# Patient Record
Sex: Female | Born: 1940 | Race: White | Hispanic: No | Smoking: Former smoker
Health system: Southern US, Community
[De-identification: ages and names within clinical notes are randomized; demographics above are authoritative.]

## PROBLEM LIST (undated history)

## (undated) DIAGNOSIS — I745 Embolism and thrombosis of iliac artery: Secondary | ICD-10-CM

## (undated) DIAGNOSIS — I70209 Unspecified atherosclerosis of native arteries of extremities, unspecified extremity: Secondary | ICD-10-CM

## (undated) DIAGNOSIS — Z9889 Other specified postprocedural states: Secondary | ICD-10-CM

## (undated) DIAGNOSIS — I10 Essential (primary) hypertension: Secondary | ICD-10-CM

## (undated) DIAGNOSIS — E079 Disorder of thyroid, unspecified: Secondary | ICD-10-CM

## (undated) DIAGNOSIS — I739 Peripheral vascular disease, unspecified: Secondary | ICD-10-CM

## (undated) DIAGNOSIS — R112 Nausea with vomiting, unspecified: Secondary | ICD-10-CM

## (undated) DIAGNOSIS — M1990 Unspecified osteoarthritis, unspecified site: Secondary | ICD-10-CM

## (undated) DIAGNOSIS — E119 Type 2 diabetes mellitus without complications: Secondary | ICD-10-CM

## (undated) HISTORY — DX: Type 2 diabetes mellitus without complications: E11.9

## (undated) HISTORY — DX: Peripheral vascular disease, unspecified: I73.9

## (undated) HISTORY — DX: Disorder of thyroid, unspecified: E07.9

## (undated) HISTORY — DX: Essential (primary) hypertension: I10

## (undated) HISTORY — DX: Unspecified osteoarthritis, unspecified site: M19.90

## (undated) HISTORY — DX: Unspecified atherosclerosis of native arteries of extremities, unspecified extremity: I70.209

## (undated) HISTORY — DX: Embolism and thrombosis of iliac artery: I74.5

---

## 1999-11-05 HISTORY — PX: THROMBECTOMY: PRO61

## 2005-02-21 ENCOUNTER — Ambulatory Visit: Payer: Self-pay | Admitting: Family Medicine

## 2006-05-26 ENCOUNTER — Ambulatory Visit: Payer: Self-pay | Admitting: Family Medicine

## 2007-06-17 ENCOUNTER — Ambulatory Visit: Payer: Self-pay

## 2008-06-29 ENCOUNTER — Ambulatory Visit: Payer: Self-pay | Admitting: Family Medicine

## 2008-07-28 ENCOUNTER — Ambulatory Visit: Payer: Self-pay | Admitting: Endocrinology

## 2008-08-04 ENCOUNTER — Ambulatory Visit: Payer: Self-pay | Admitting: Endocrinology

## 2008-11-04 HISTORY — PX: JOINT REPLACEMENT: SHX530

## 2008-11-04 HISTORY — PX: EYE SURGERY: SHX253

## 2009-02-16 ENCOUNTER — Ambulatory Visit: Payer: Self-pay | Admitting: Rheumatology

## 2009-04-27 ENCOUNTER — Ambulatory Visit: Payer: Self-pay | Admitting: Unknown Physician Specialty

## 2009-05-02 ENCOUNTER — Inpatient Hospital Stay: Payer: Self-pay | Admitting: Unknown Physician Specialty

## 2009-05-05 ENCOUNTER — Encounter: Payer: Self-pay | Admitting: Internal Medicine

## 2009-06-04 ENCOUNTER — Encounter: Payer: Self-pay | Admitting: Internal Medicine

## 2009-07-05 ENCOUNTER — Encounter: Payer: Self-pay | Admitting: Internal Medicine

## 2009-08-04 ENCOUNTER — Encounter: Payer: Self-pay | Admitting: Internal Medicine

## 2009-08-23 ENCOUNTER — Ambulatory Visit: Payer: Self-pay | Admitting: Family Medicine

## 2009-09-04 ENCOUNTER — Encounter: Payer: Self-pay | Admitting: Internal Medicine

## 2009-09-05 ENCOUNTER — Ambulatory Visit: Payer: Self-pay | Admitting: Ophthalmology

## 2009-09-05 ENCOUNTER — Ambulatory Visit: Payer: Self-pay | Admitting: Cardiology

## 2009-09-18 ENCOUNTER — Ambulatory Visit: Payer: Self-pay | Admitting: Ophthalmology

## 2009-10-04 ENCOUNTER — Encounter: Payer: Self-pay | Admitting: Internal Medicine

## 2009-11-04 ENCOUNTER — Encounter: Payer: Self-pay | Admitting: Internal Medicine

## 2009-12-05 ENCOUNTER — Encounter: Payer: Self-pay | Admitting: Internal Medicine

## 2010-01-02 ENCOUNTER — Encounter: Payer: Self-pay | Admitting: Internal Medicine

## 2010-02-02 ENCOUNTER — Encounter: Payer: Self-pay | Admitting: Internal Medicine

## 2010-08-29 ENCOUNTER — Ambulatory Visit: Payer: Self-pay

## 2010-11-04 HISTORY — PX: COLONOSCOPY: SHX174

## 2011-09-11 ENCOUNTER — Ambulatory Visit: Payer: Self-pay | Admitting: Family Medicine

## 2012-12-28 ENCOUNTER — Ambulatory Visit: Payer: Self-pay | Admitting: Family Medicine

## 2013-01-04 ENCOUNTER — Ambulatory Visit: Payer: Self-pay | Admitting: Family Medicine

## 2013-01-05 ENCOUNTER — Ambulatory Visit: Payer: Self-pay | Admitting: Orthopedic Surgery

## 2013-04-07 ENCOUNTER — Encounter: Payer: Self-pay | Admitting: *Deleted

## 2013-08-18 ENCOUNTER — Ambulatory Visit (INDEPENDENT_AMBULATORY_CARE_PROVIDER_SITE_OTHER): Payer: Medicare Other | Admitting: General Surgery

## 2013-08-18 ENCOUNTER — Encounter: Payer: Self-pay | Admitting: General Surgery

## 2013-08-18 VITALS — BP 130/82 | HR 68 | Resp 14 | Ht 60.0 in | Wt 130.0 lb

## 2013-08-18 DIAGNOSIS — I70219 Atherosclerosis of native arteries of extremities with intermittent claudication, unspecified extremity: Secondary | ICD-10-CM | POA: Insufficient documentation

## 2013-08-18 DIAGNOSIS — I70209 Unspecified atherosclerosis of native arteries of extremities, unspecified extremity: Secondary | ICD-10-CM

## 2013-08-18 NOTE — Progress Notes (Signed)
Patient ID: Laura [name], female   DOB: February 10, 1941, 72 y.o.   MRN: [id number]  Chief Complaint  Patient presents with  . Other    HPI Laura [name] is a 72 y.o. female. here today for follow up vascular evaluation. She is s/p R iliac artery embolectomy over 12 years ago. Pt denies any leg pain.   HPI  Past Medical History  Diagnosis Date  . Hypertension   . Diabetes mellitus without complication   . Peripheral vascular disease   . Thyroid disease   . Arthritis   . Personal history of tobacco use, presenting hazards to health   . Atherosclerosis of native arteries of the extremities, unspecified   . Embolism and thrombosis of iliac artery     Past Surgical History  Procedure Laterality Date  . Eye surgery Right 2010  . Colonoscopy  2012  . Thrombectomy  2001  . Joint replacement Left 2010    Hip    History reviewed. No pertinent family history.  Social History History  Substance Use Topics  . Smoking status: Former Smoker -- 1.00 packs/day for 20 years  . Smokeless tobacco: Never Used  . Alcohol Use: No    No Known Allergies  Current Outpatient Prescriptions  Medication Sig Dispense Refill  . alendronate (FOSAMAX) 70 MG tablet Take 70 mg by mouth every 7 (seven) days. Take with a full glass of water on an empty stomach.      Marland Kitchen aspirin 81 MG tablet Take 81 mg by mouth daily.      . benazepril (LOTENSIN) 20 MG tablet Take 20 mg by mouth daily.      . insulin lispro (HUMALOG) 100 UNIT/ML injection Inject 10 Units into the skin 3 (three) times daily before meals.      Marland Kitchen levothyroxine (SYNTHROID, LEVOTHROID) 75 MCG tablet Take 75 mcg by mouth daily before breakfast.      . pravastatin (PRAVACHOL) 40 MG tablet Take 40 mg by mouth daily.       No current facility-administered medications for this visit.    Review of Systems Review of Systems  Constitutional: Negative.   Respiratory: Negative.   Cardiovascular: Negative.     Blood pressure 130/82, pulse  68, resp. rate 14, height 5' (1.524 m), weight 130 lb (58.968 kg).  Physical Exam Physical Exam  Constitutional: She is oriented to person, place, and time. She appears well-developed and well-nourished.  Eyes: Conjunctivae are normal. No scleral icterus.  Neck: Carotid bruit is not present. No mass and no thyromegaly present.  Cardiovascular: Normal rate, regular rhythm and normal heart sounds.   Pulses:      Carotid pulses are 2+ on the right side, and 2+ on the left side.      Femoral pulses are 2+ on the right side, and 2+ on the left side.      Popliteal pulses are 2+ on the right side, and 2+ on the left side.       Dorsalis pedis pulses are 1+ on the right side, and 1+ on the left side.       Posterior tibial pulses are 0 on the right side, and 2+ on the left side.  No edema or varicose veins.  Feet are warm with brisk capillary refill.  Pulmonary/Chest: Effort normal and breath sounds normal.  Lymphadenopathy:    She has no cervical adenopathy.  Neurological: She is alert and oriented to person, place, and time.  Skin: Skin is warm and dry.  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 Asymptomatic and stable.     Plan    Continue periodic follow up. Next evaluation in 1 year.         Giannie Soliday G 08/18/2013, 12:58 PM

## 2013-08-18 NOTE — Patient Instructions (Addendum)
Patient to return in one year. Call for any leg symptoms.

## 2014-01-25 ENCOUNTER — Ambulatory Visit: Payer: Self-pay | Admitting: Family Medicine

## 2014-08-18 ENCOUNTER — Ambulatory Visit: Payer: Self-pay | Admitting: General Surgery

## 2014-09-05 ENCOUNTER — Encounter: Payer: Self-pay | Admitting: General Surgery

## 2014-11-09 ENCOUNTER — Ambulatory Visit: Payer: Self-pay | Admitting: Family Medicine

## 2014-11-14 ENCOUNTER — Ambulatory Visit (INDEPENDENT_AMBULATORY_CARE_PROVIDER_SITE_OTHER): Payer: Medicare Other | Admitting: General Surgery

## 2014-11-14 ENCOUNTER — Encounter: Payer: Self-pay | Admitting: General Surgery

## 2014-11-14 VITALS — BP 148/72 | HR 74 | Resp 14 | Ht 60.0 in | Wt 118.0 lb

## 2014-11-14 DIAGNOSIS — I739 Peripheral vascular disease, unspecified: Secondary | ICD-10-CM

## 2014-11-14 NOTE — Progress Notes (Signed)
Patient ID: Laura [name], female   DOB: 06-21-1941, 74 y.o.   MRN: [id number]  Chief Complaint  Patient presents with  . Other    leg check     HPI Laura [name] is a 74 y.o. female here today for her one year follow up vascular evaluation. She is s/p R iliac artery embolectomy over 12 years ago. Pt denies any leg pain, swelling or skin changes  HPI  Past Medical History  Diagnosis Date  . Hypertension   . Diabetes mellitus without complication   . Peripheral vascular disease   . Thyroid disease   . Arthritis   . Personal history of tobacco use, presenting hazards to health   . Atherosclerosis of native arteries of the extremities, unspecified   . Embolism and thrombosis of iliac artery     Past Surgical History  Procedure Laterality Date  . Eye surgery Right 2010  . Colonoscopy  2012  . Thrombectomy  2001  . Joint replacement Left 2010    Hip    History reviewed. No pertinent family history.  Social History History  Substance Use Topics  . Smoking status: Former Smoker -- 1.00 packs/day for 20 years  . Smokeless tobacco: Never Used  . Alcohol Use: No    No Known Allergies  Current Outpatient Prescriptions  Medication Sig Dispense Refill  . alendronate (FOSAMAX) 70 MG tablet Take 70 mg by mouth every 7 (seven) days. Take with a full glass of water on an empty stomach.    Marland Kitchen aspirin 81 MG tablet Take 81 mg by mouth daily.    . benazepril (LOTENSIN) 20 MG tablet Take 20 mg by mouth daily.    . insulin glargine (LANTUS) 100 UNIT/ML injection Inject into the skin at bedtime.    . insulin lispro (HUMALOG) 100 UNIT/ML injection Inject 10 Units into the skin 3 (three) times daily before meals.    Marland Kitchen levothyroxine (SYNTHROID, LEVOTHROID) 75 MCG tablet Take 75 mcg by mouth daily before breakfast.    . pravastatin (PRAVACHOL) 40 MG tablet Take 40 mg by mouth daily.     No current facility-administered medications for this visit.    Review of Systems Review of  Systems  Constitutional: Negative.   Respiratory: Negative.   Cardiovascular: Negative.     Blood pressure 148/72, pulse 74, resp. rate 14, height 5' (1.524 m), weight 118 lb (53.524 kg).  Physical Exam Physical Exam  Constitutional: She is oriented to person, place, and time. She appears well-developed and well-nourished.  Eyes: Conjunctivae are normal. No scleral icterus.  Neck: Neck supple.  Cardiovascular: Normal rate, regular rhythm and normal heart sounds.   Pulses:      Carotid pulses are 2+ on the right side, and 2+ on the left side.      Radial pulses are 2+ on the right side, and 2+ on the left side.       Femoral pulses are 2+ on the right side, and 2+ on the left side.      Popliteal pulses are 2+ on the right side, and 2+ on the left side.       Dorsalis pedis pulses are 2+ on the right side, and 1+ on the left side.       Posterior tibial pulses are 0 on the right side, and 2+ on the left side.  Feet are warm, brisk cap refill. No edema , no VV  Pulmonary/Chest: Effort normal and breath sounds normal.  Abdominal: Soft. Bowel  sounds are normal. There is no tenderness.  Lymphadenopathy:    She has no cervical adenopathy.  Neurological: She is alert and oriented to person, place, and time.  Skin: Skin is warm and dry.  Vitals reviewed.   Data Reviewed   None Assessment   Mild PAD-involving tibial arteries.  Asymptomatic and stable.     Plan    Patient to return in one year.       SANKAR,SEEPLAPUTHUR G 11/14/2014, 2:37 PM

## 2014-11-14 NOTE — Patient Instructions (Signed)
Patient to return in one year. 

## 2015-02-01 ENCOUNTER — Ambulatory Visit: Admit: 2015-02-01 | Disposition: A | Discharge status: Home / Self Care | Payer: Self-pay | Admitting: Family Medicine

## 2015-03-21 ENCOUNTER — Ambulatory Visit
Admission: RE | Admit: 2015-03-21 | Discharge: 2015-03-21 | Disposition: A | Discharge status: Home / Self Care | Payer: Medicare Other | Source: Ambulatory Visit | Attending: Family Medicine | Admitting: Family Medicine

## 2015-03-21 ENCOUNTER — Other Ambulatory Visit: Payer: Self-pay | Admitting: Family Medicine

## 2015-03-21 DIAGNOSIS — M25552 Pain in left hip: Secondary | ICD-10-CM | POA: Diagnosis not present

## 2015-03-21 DIAGNOSIS — S79912A Unspecified injury of left hip, initial encounter: Secondary | ICD-10-CM | POA: Diagnosis not present

## 2015-03-21 DIAGNOSIS — M1611 Unilateral primary osteoarthritis, right hip: Secondary | ICD-10-CM | POA: Diagnosis not present

## 2015-03-29 DIAGNOSIS — M1611 Unilateral primary osteoarthritis, right hip: Secondary | ICD-10-CM | POA: Diagnosis not present

## 2015-04-12 DIAGNOSIS — M1611 Unilateral primary osteoarthritis, right hip: Secondary | ICD-10-CM | POA: Diagnosis not present

## 2015-04-14 NOTE — Progress Notes (Signed)
Please put orders in Epic surgery [id number] pre op 04-21-15 Thanks

## 2015-04-16 ENCOUNTER — Ambulatory Visit: Payer: Self-pay | Admitting: Orthopedic Surgery

## 2015-04-16 MED ORDER — DEXAMETHASONE SODIUM PHOSPHATE 4 MG/ML IJ SOLN: 4.0000 mg | INTRAMUSCULAR | Status: AC

## 2015-04-16 MED ORDER — ACETAMINOPHEN 10 MG/ML IV SOLN: 1000.0000 mg | INTRAVENOUS | Status: AC

## 2015-04-16 MED ORDER — SODIUM CHLORIDE 0.9 % IV SOLN: 4.0000 mg | INTRAVENOUS | Status: AC

## 2015-04-17 ENCOUNTER — Telehealth: Payer: Self-pay | Admitting: Family Medicine

## 2015-04-17 NOTE — Telephone Encounter (Signed)
Patient was informed that forms has been completed and faxed. Patient stated she goes for a pre-op work-up on Friday and then will have her surgery next week.

## 2015-04-17 NOTE — Telephone Encounter (Signed)
Pt will be having hip replacement on April 27 2015 and she is needing surgical clearance. Have you received the forms and does she need to come in to be seen? Please return call [phone]

## 2015-04-17 NOTE — Telephone Encounter (Signed)
Surgical clearance forms filled out and faxed back. No office appointment needed unless they find anything abnormal during pre-operation work up.

## 2015-04-20 NOTE — Patient Instructions (Addendum)
YOUR PROCEDURE IS SCHEDULED ON : 04/27/15  REPORT TO [facility] MAIN ENTRANCE FOLLOW SIGNS TO SHORT STAY CENTER AT :  11:30 AM  CALL THIS NUMBER IF YOU HAVE PROBLEMS THE MORNING OF SURGERY [phone]  REMEMBER:ONLY 1 PER PERSON MAY GO TO SHORT STAY WITH YOU TO GET READY THE MORNING OF YOUR SURGERY  DO NOT EAT FOOD  AFTER MIDNIGHT  MAY HAVE CLEAR LIQUIDS UNTIL 7:30 AM  TAKE THESE MEDICINES THE MORNING OF SURGERY:  LEVOTHYROXINE / DO NOT TAKE INSULIN THE MORNING OF SURGERY     CLEAR LIQUID DIET   Foods Allowed                                                                     Foods Excluded  Coffee and tea, regular and decaf                             liquids that you cannot  Plain Jell-O in any flavor                                             see through such as: Fruit ices (not with fruit pulp)                                     milk, soups, orange juice  Iced Popsicles                                                 All solid food Carbonated beverages, regular and diet                                    Cranberry, grape and apple juices Sports drinks like Gatorade Lightly seasoned clear broth or consume(fat free) Sugar, honey syrup  _____________________________________________________________________    YOU MAY NOT HAVE ANY METAL ON YOUR BODY INCLUDING HAIR PINS AND PIERCING'S. DO NOT WEAR JEWELRY, MAKEUP, LOTIONS, POWDERS OR PERFUMES. DO NOT WEAR NAIL POLISH. DO NOT SHAVE 48 HRS PRIOR TO SURGERY. MEN MAY SHAVE FACE AND NECK.  DO NOT BRING VALUABLES TO HOSPITAL. [place] [facility] IS NOT RESPONSIBLE FOR VALUABLES.  CONTACTS, DENTURES OR PARTIALS MAY NOT BE WORN TO SURGERY. LEAVE SUITCASE IN CAR. CAN BE BROUGHT TO ROOM AFTER SURGERY.  PATIENTS DISCHARGED THE DAY OF SURGERY WILL NOT BE ALLOWED TO DRIVE HOME.  PLEASE READ OVER THE FOLLOWING INSTRUCTION SHEETS _________________________________________________________________________________                                           [place] - PREPARING FOR SURGERY  Before surgery, you can play an important role.  Because skin is not sterile, your  skin needs to be as free of germs as possible.  You can reduce the number of germs on your skin by washing with CHG (chlorahexidine gluconate) soap before surgery.  CHG is an antiseptic cleaner which kills germs and bonds with the skin to continue killing germs even after washing. Please DO NOT use if you have an allergy to CHG or antibacterial soaps.  If your skin becomes reddened/irritated stop using the CHG and inform your nurse when you arrive at Short Stay. Do not shave (including legs and underarms) for at least 48 hours prior to the first CHG shower.  You may shave your face. Please follow these instructions carefully:   1.  Shower with CHG Soap the night before surgery and the  morning of Surgery.   2.  If you choose to wash your hair, wash your hair first as usual with your  normal  Shampoo.   3.  After you shampoo, rinse your hair and body thoroughly to remove the  shampoo.                                         4.  Use CHG as you would any other liquid soap.  You can apply chg directly  to the skin and wash . Gently wash with scrungie or clean wascloth    5.  Apply the CHG Soap to your body ONLY FROM THE NECK DOWN.   Do not use on open                           Wound or open sores. Avoid contact with eyes, ears mouth and genitals (private parts).                        Genitals (private parts) with your normal soap.              6.  Wash thoroughly, paying special attention to the area where your surgery  will be performed.   7.  Thoroughly rinse your body with warm water from the neck down.   8.  DO NOT shower/wash with your normal soap after using and rinsing off  the CHG Soap .                9.  Pat yourself dry with a clean towel.             10.  Wear clean night clothes to bed after shower             11.  Place clean  sheets on your bed the night of your first shower and do not  sleep with pets.  Day of Surgery : Do not apply any lotions/deodorants the morning of surgery.  Please wear clean clothes to the hospital/surgery center.  FAILURE TO FOLLOW THESE INSTRUCTIONS MAY RESULT IN THE CANCELLATION OF YOUR SURGERY    PATIENT SIGNATURE_________________________________  ______________________________________________________________________     Laura [name]  An incentive spirometer is a tool that can help keep your lungs clear and active. This tool measures how well you are filling your lungs with each breath. Taking long deep breaths may help reverse or decrease the chance of developing breathing (pulmonary) problems (especially infection) following:  A long period of time when you are unable to move or be active. BEFORE THE PROCEDURE  If the spirometer includes an indicator to show your best effort, your nurse or respiratory therapist will set it to a desired goal.  If possible, sit up straight or lean slightly forward. Try not to slouch.  Hold the incentive spirometer in an upright position. INSTRUCTIONS FOR USE   Sit on the edge of your bed if possible, or sit up as far as you can in bed or on a chair.  Hold the incentive spirometer in an upright position.  Breathe out normally.  Place the mouthpiece in your mouth and seal your lips tightly around it.  Breathe in slowly and as deeply as possible, raising the piston or the ball toward the top of the column.  Hold your breath for 3-5 seconds or for as long as possible. Allow the piston or ball to fall to the bottom of the column.  Remove the mouthpiece from your mouth and breathe out normally.  Rest for a few seconds and repeat Steps 1 through 7 at least 10 times every 1-2 hours when you are awake. Take your time and take a few normal breaths between deep breaths.  The spirometer may include an indicator to show your best  effort. Use the indicator as a goal to work toward during each repetition.  After each set of 10 deep breaths, practice coughing to be sure your lungs are clear. If you have an incision (the cut made at the time of surgery), support your incision when coughing by placing a pillow or rolled up towels firmly against it. Once you are able to get out of bed, walk around indoors and cough well. You may stop using the incentive spirometer when instructed by your caregiver.  RISKS AND COMPLICATIONS  Take your time so you do not get dizzy or light-headed.  If you are in pain, you may need to take or ask for pain medication before doing incentive spirometry. It is harder to take a deep breath if you are having pain. AFTER USE  Rest and breathe slowly and easily.  It can be helpful to keep track of a log of your progress. Your caregiver can provide you with a simple table to help with this. If you are using the spirometer at home, follow these instructions: [place] IF:   You are having difficultly using the spirometer.  You have trouble using the spirometer as often as instructed.  Your pain medication is not giving enough relief while using the spirometer.  You develop fever of 100.5 F (38.1 C) or higher. SEEK IMMEDIATE MEDICAL CARE IF:   You cough up bloody sputum that had not been present before.  You develop fever of 102 F (38.9 C) or greater.  You develop worsening pain at or near the incision site. MAKE SURE YOU:   Understand these instructions.  Will watch your condition.  Will get help right away if you are not doing well or get worse. Document Released: 03/03/2007 Document Revised: 01/13/2012 Document Reviewed: 05/04/2007 Exit[employer] Patient Information 2014 [employer], [place].   ________________________________________________________________________  WHAT IS A BLOOD TRANSFUSION? Blood Transfusion Information  A transfusion is the replacement of blood or some of  its parts. Blood is made up of multiple cells which provide different functions.  Red blood cells carry oxygen and are used for blood loss replacement.  White blood cells fight against infection.  Platelets control bleeding.  Plasma helps clot blood.  Other blood products are available for specialized needs, such as hemophilia or other clotting  disorders. BEFORE THE TRANSFUSION  Who gives blood for transfusions?   Healthy volunteers who are fully evaluated to make sure their blood is safe. This is blood bank blood. Transfusion therapy is the safest it has ever been in the practice of medicine. Before blood is taken from a donor, a complete history is taken to make sure that person has no history of diseases nor engages in risky social behavior (examples are intravenous drug use or sexual activity with multiple partners). The donor's travel history is screened to minimize risk of transmitting infections, such as malaria. The donated blood is tested for signs of infectious diseases, such as HIV and hepatitis. The blood is then tested to be sure it is compatible with you in order to minimize the chance of a transfusion reaction. If you or a relative donates blood, this is often done in anticipation of surgery and is not appropriate for emergency situations. It takes many days to process the donated blood. RISKS AND COMPLICATIONS Although transfusion therapy is very safe and saves many lives, the main dangers of transfusion include:   Getting an infectious disease.  Developing a transfusion reaction. This is an allergic reaction to something in the blood you were given. Every precaution is taken to prevent this. The decision to have a blood transfusion has been considered carefully by your caregiver before blood is given. Blood is not given unless the benefits outweigh the risks. AFTER THE TRANSFUSION  Right after receiving a blood transfusion, you will usually feel much better and more  energetic. This is especially true if your red blood cells have gotten low (anemic). The transfusion raises the level of the red blood cells which carry oxygen, and this usually causes an energy increase.  The nurse administering the transfusion will monitor you carefully for complications. HOME CARE INSTRUCTIONS  No special instructions are needed after a transfusion. You may find your energy is better. Speak with your caregiver about any limitations on activity for underlying diseases you may have. SEEK MEDICAL CARE IF:   Your condition is not improving after your transfusion.  You develop redness or irritation at the intravenous (IV) site. SEEK IMMEDIATE MEDICAL CARE IF:  Any of the following symptoms occur over the next 12 hours:  Shaking chills.  You have a temperature by mouth above 102 F (38.9 C), not controlled by medicine.  Chest, back, or muscle pain.  People around you feel you are not acting correctly or are confused.  Shortness of breath or difficulty breathing.  Dizziness and fainting.  You get a rash or develop hives.  You have a decrease in urine output.  Your urine turns a dark color or changes to pink, red, or brown. Any of the following symptoms occur over the next 10 days:  You have a temperature by mouth above 102 F (38.9 C), not controlled by medicine.  Shortness of breath.  Weakness after normal activity.  The white part of the eye turns yellow (jaundice).  You have a decrease in the amount of urine or are urinating less often.  Your urine turns a dark color or changes to pink, red, or brown. Document Released: 10/18/2000 Document Revised: 01/13/2012 Document Reviewed: 06/06/2008 [facility] Patient Information 2014 [place], [place].  _______________________________________________________________________

## 2015-04-21 ENCOUNTER — Encounter (HOSPITAL_COMMUNITY): Payer: Self-pay

## 2015-04-21 ENCOUNTER — Encounter (HOSPITAL_COMMUNITY)
Admission: RE | Admit: 2015-04-21 | Discharge: 2015-04-21 | Disposition: A | Discharge status: Home / Self Care | Payer: Medicare Other | Source: Ambulatory Visit | Attending: Orthopedic Surgery | Admitting: Orthopedic Surgery

## 2015-04-21 DIAGNOSIS — M1611 Unilateral primary osteoarthritis, right hip: Secondary | ICD-10-CM | POA: Diagnosis not present

## 2015-04-21 DIAGNOSIS — Z01818 Encounter for other preprocedural examination: Secondary | ICD-10-CM | POA: Insufficient documentation

## 2015-04-21 LAB — COMPREHENSIVE METABOLIC PANEL
ALBUMIN: 3.8 g/dL (ref 3.5–5.0)
ALT: 15 U/L (ref 14–54)
AST: 15 U/L (ref 15–41)
Alkaline Phosphatase: 57 U/L (ref 38–126)
Anion gap: 8 (ref 5–15)
BUN: 24 mg/dL — ABNORMAL HIGH (ref 6–20)
CALCIUM: 10.1 mg/dL (ref 8.9–10.3)
CO2: 30 mmol/L (ref 22–32)
CREATININE: 0.7 mg/dL (ref 0.44–1.00)
Chloride: 102 mmol/L (ref 101–111)
GFR calc Af Amer: >60 mL/min (ref >60)
GFR calc non Af Amer: >60 mL/min (ref >60)
Glucose, Bld: 82 mg/dL (ref 65–99)
POTASSIUM: 4.9 mmol/L (ref 3.5–5.1)
Sodium: 140 mmol/L (ref 135–145)
Total Bilirubin: 0.3 mg/dL (ref 0.3–1.2)
Total Protein: 6.1 g/dL — ABNORMAL LOW (ref 6.5–8.1)

## 2015-04-21 LAB — CBC
HEMATOCRIT: 38.2 % (ref 36.0–46.0)
HEMOGLOBIN: 12.6 g/dL (ref 12.0–15.0)
MCH: 29.3 pg (ref 26.0–34.0)
MCHC: 33 g/dL (ref 30.0–36.0)
MCV: 88.8 fL (ref 78.0–100.0)
Platelets: 227 10*3/uL (ref 150–400)
RBC: 4.3 MIL/uL (ref 3.87–5.11)
RDW: 13.1 % (ref 11.5–15.5)
WBC: 4.9 10*3/uL (ref 4.0–10.5)

## 2015-04-21 LAB — SURGICAL PCR SCREEN
MRSA, PCR: NEGATIVE
STAPHYLOCOCCUS AUREUS: POSITIVE — AB

## 2015-04-21 LAB — URINALYSIS, ROUTINE W REFLEX MICROSCOPIC
BILIRUBIN URINE: NEGATIVE
GLUCOSE, UA: NEGATIVE mg/dL
Hgb urine dipstick: NEGATIVE
KETONES UR: NEGATIVE mg/dL
Leukocytes, UA: NEGATIVE
Nitrite: NEGATIVE
PROTEIN: NEGATIVE mg/dL
Specific Gravity, Urine: 1.01 (ref 1.005–1.030)
UROBILINOGEN UA: 0.2 mg/dL (ref 0.0–1.0)
pH: 6 (ref 5.0–8.0)

## 2015-04-21 LAB — ABO/RH: ABO/RH(D): AB POS

## 2015-04-21 LAB — PROTIME-INR
INR: 1.03 (ref 0.00–1.49)
Prothrombin Time: 13.7 seconds (ref 11.6–15.2)

## 2015-04-21 LAB — APTT: APTT: 27 s (ref 24–37)

## 2015-04-21 NOTE — Progress Notes (Signed)
CMET / PCR faxed to Dr. Linna Caprice / Rx Mupuricin called to [facility] [phone] - pt notified

## 2015-04-24 ENCOUNTER — Telehealth: Payer: Self-pay | Admitting: Family Medicine

## 2015-04-24 NOTE — Anesthesia Preprocedure Evaluation (Addendum)
Anesthesia Evaluation  Patient identified by MRN, date of birth, ID band Patient awake    Reviewed: Allergy & Precautions, NPO status , Patient's Chart, lab work & pertinent test results, reviewed documented beta blocker date and time   History of Anesthesia Complications (+) PONV and history of anesthetic complications  Airway Mallampati: II   Neck ROM: full    Dental   Pulmonary former smoker (quit 2004, 20 pack year),  breath sounds clear to auscultation        Cardiovascular hypertension, Pt. on medications + Peripheral Vascular Disease Rhythm:regular Rate:Normal     Neuro/Psych    GI/Hepatic negative GI ROS, Neg liver ROS,   Endo/Other  diabetes, Type 2, Insulin Dependent  Renal/GU      Musculoskeletal  (+) Arthritis -,   Abdominal   Peds  Hematology 12/28, plts 227   Anesthesia Other Findings   Reproductive/Obstetrics                         Anesthesia Physical Anesthesia Plan  ASA: III  Anesthesia Plan: Spinal and MAC   Post-op Pain Management:    Induction: Intravenous  Airway Management Planned: Natural Airway  Additional Equipment:   Intra-op Plan:   Post-operative Plan:   Informed Consent: I have reviewed the patients History and Physical, chart, labs and discussed the procedure including the risks, benefits and alternatives for the proposed anesthesia with the patient or authorized representative who has indicated his/her understanding and acceptance.   Dental advisory given  Plan Discussed with: CRNA  Anesthesia Plan Comments:       Anesthesia Quick Evaluation

## 2015-04-24 NOTE — Telephone Encounter (Signed)
Laura [name] from Dr Kathline Magic office is calling wanting to know when they can get her surgical clearance paperwork that was faxed on 04/13/15 and 04/19/15. Patient is having surgery on 04/27/15.

## 2015-04-24 NOTE — Telephone Encounter (Signed)
Paperwork has already been completed and has been sent to be scanned into her chart. Paperwork was faxed to the attention of Matthias Hughs at [phone]. Confirmation was received.

## 2015-04-27 ENCOUNTER — Ambulatory Visit: Payer: Self-pay | Admitting: Orthopedic Surgery

## 2015-04-27 DIAGNOSIS — Z794 Long term (current) use of insulin: Secondary | ICD-10-CM | POA: Diagnosis not present

## 2015-04-27 DIAGNOSIS — Z79899 Other long term (current) drug therapy: Secondary | ICD-10-CM | POA: Diagnosis not present

## 2015-04-27 DIAGNOSIS — M1611 Unilateral primary osteoarthritis, right hip: Secondary | ICD-10-CM | POA: Diagnosis not present

## 2015-04-27 DIAGNOSIS — Z96642 Presence of left artificial hip joint: Secondary | ICD-10-CM | POA: Diagnosis not present

## 2015-04-27 DIAGNOSIS — E119 Type 2 diabetes mellitus without complications: Secondary | ICD-10-CM | POA: Diagnosis not present

## 2015-04-27 DIAGNOSIS — Z87891 Personal history of nicotine dependence: Secondary | ICD-10-CM | POA: Diagnosis not present

## 2015-04-27 DIAGNOSIS — I10 Essential (primary) hypertension: Secondary | ICD-10-CM | POA: Diagnosis not present

## 2015-04-27 LAB — TYPE AND SCREEN
ABO/RH(D): AB POS
Antibody Screen: NEGATIVE

## 2015-04-27 NOTE — H&P (Signed)
TOTAL HIP ADMISSION H&P  Patient is admitted for right total hip arthroplasty.  Subjective:  Chief Complaint: right hip pain  HPI: Lillard AnesWilma F Fahs, 74 y.o. 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  Onset of symptoms was gradual starting 6 years ago with gradually worsening course since that time.The patient noted no past surgery on the right hip(s).  Patient currently rates pain in the right hip at 10 out of 10 with activity. Patient has night pain, worsening of pain with activity and weight bearing and pain that interfers with activities of daily living. Patient has evidence of subchondral cysts, subchondral sclerosis, periarticular osteophytes, joint space narrowing and collapse of the femoral head by imaging studies. This condition presents safety issues increasing the risk of falls. There is no current active infection.  Patient Active Problem List   Diagnosis Date Noted  . Atherosclerosis of native arteries of the extremities, unspecified 08/18/2013   Past Medical History  Diagnosis Date  . Hypertension   . Diabetes mellitus without complication   . Peripheral vascular disease   . Thyroid disease   . Arthritis   . Atherosclerosis of native arteries of the extremities, unspecified   . Embolism and thrombosis of iliac artery     Past Surgical History  Procedure Laterality Date  . Eye surgery Right 2010  . Colonoscopy  2012  . Thrombectomy  2001    RT GROIN  . Joint replacement Left 2010    Hip     (Not in a hospital admission) No Known Allergies  History  Substance Use Topics  . Smoking status: Former Smoker -- 1.00 packs/day for 20 years    Quit date: 04/21/2003  . Smokeless tobacco: Never Used  .  Alcohol Use: No    No family history on file.   Review of Systems  Constitutional: Negative.   HENT: Negative.   Eyes: Negative.   Respiratory: Negative.   Cardiovascular: Negative.   Gastrointestinal: Negative.   Genitourinary: Negative.   Musculoskeletal: Positive for joint pain.  Skin: Negative.   Neurological: Negative.   Endo/Heme/Allergies: Negative.   Psychiatric/Behavioral: Negative.     Objective:  Physical Exam  Vitals reviewed. Constitutional: She is oriented to person, place, and time. She appears well-developed and well-nourished.  HENT:  Head: Normocephalic and atraumatic.  Eyes: Conjunctivae and EOM are normal. Pupils are equal, round, and reactive to light.  Neck: Normal range of motion. Neck supple.  Cardiovascular: Normal rate, regular rhythm, normal heart sounds and intact distal pulses.   Respiratory: Effort normal and breath sounds normal.  GI: Soft. Bowel sounds are normal.  Genitourinary:  deferred  Musculoskeletal:       Right hip: She exhibits decreased range of motion.  Neurological: She is alert and oriented to person, place, and time. She has normal reflexes.  Skin: Skin is warm and dry.  Psychiatric: She has a normal mood and affect. Her behavior is normal. Judgment and thought content normal.    Vital signs in last 24 hours: @VSRANGES @  Labs:   Estimated body mass index is 22.50 kg/(m^2) as calculated from the following:   Height as of 04/21/15: 5\' 1"  (1.549 m).   Weight as of 04/21/15: 53.978 kg (119 lb).   Imaging Review Plain radiographs demonstrate severe  degenerative joint disease of the right hip(s). The bone quality appears to be adequate for age and reported activity level.  Assessment/Plan:  End stage arthritis, right hip(s)  The patient history, physical examination, clinical judgement of the provider and imaging studies are consistent with end stage degenerative joint disease of the right hip(s) and total hip  arthroplasty is deemed medically necessary. The treatment options including medical management, injection therapy, arthroscopy and arthroplasty were discussed at length. The risks and benefits of total hip arthroplasty were presented and reviewed. The risks due to aseptic loosening, infection, stiffness, dislocation/subluxation,  thromboembolic complications and other imponderables were discussed.  The patient acknowledged the explanation, agreed to proceed with the plan and consent was signed. Patient is being admitted for inpatient treatment for surgery, pain control, PT, OT, prophylactic antibiotics, VTE prophylaxis, progressive ambulation and ADL's and discharge planning.The patient is planning to be discharged home with home health services. Plan for apixiban.

## 2015-04-28 ENCOUNTER — Other Ambulatory Visit: Payer: Self-pay | Admitting: Family Medicine

## 2015-04-28 ENCOUNTER — Encounter (HOSPITAL_COMMUNITY): Payer: Self-pay | Admitting: *Deleted

## 2015-04-28 DIAGNOSIS — Z794 Long term (current) use of insulin: Principal | ICD-10-CM

## 2015-04-28 DIAGNOSIS — E1165 Type 2 diabetes mellitus with hyperglycemia: Secondary | ICD-10-CM

## 2015-04-28 DIAGNOSIS — E1151 Type 2 diabetes mellitus with diabetic peripheral angiopathy without gangrene: Secondary | ICD-10-CM | POA: Insufficient documentation

## 2015-04-28 DIAGNOSIS — IMO0002 Reserved for concepts with insufficient information to code with codable children: Secondary | ICD-10-CM | POA: Insufficient documentation

## 2015-04-28 DIAGNOSIS — E119 Type 2 diabetes mellitus without complications: Secondary | ICD-10-CM

## 2015-04-28 MED ORDER — GLUCOSE BLOOD VI STRP: ORAL_STRIP | Status: DC

## 2015-04-28 NOTE — Telephone Encounter (Signed)
Patient called requesting a refill of her glucose strips

## 2015-04-28 NOTE — Progress Notes (Signed)
Pt states she has used the Mupirocin ointment 2x a day for 5 days. Finished it on 04/27/15. Pt states her last dose of Aspirin was 04/26/15.  Denies any chest pain or sob, no cardiac history.

## 2015-04-28 NOTE — Telephone Encounter (Signed)
Pt is needing STRIPS only for her ACCU CHECK MACHINE . PHARM IS [facility].

## 2015-05-01 ENCOUNTER — Encounter (HOSPITAL_COMMUNITY)
Admission: RE | Disposition: A | Discharge status: Home Health | Payer: Self-pay | Source: Ambulatory Visit | Attending: Orthopedic Surgery

## 2015-05-01 ENCOUNTER — Inpatient Hospital Stay (HOSPITAL_COMMUNITY)
Admission: RE | Admit: 2015-05-01 | Discharge: 2015-05-02 | DRG: 470 | Disposition: A | Discharge status: Home Health | Payer: Medicare Other | Source: Ambulatory Visit | Attending: Orthopedic Surgery | Admitting: Orthopedic Surgery

## 2015-05-01 ENCOUNTER — Inpatient Hospital Stay (HOSPITAL_COMMUNITY): Payer: Medicare Other

## 2015-05-01 ENCOUNTER — Encounter (HOSPITAL_COMMUNITY): Payer: Self-pay | Admitting: *Deleted

## 2015-05-01 ENCOUNTER — Inpatient Hospital Stay (HOSPITAL_COMMUNITY): Payer: Medicare Other | Admitting: Anesthesiology

## 2015-05-01 DIAGNOSIS — Z87891 Personal history of nicotine dependence: Secondary | ICD-10-CM | POA: Diagnosis not present

## 2015-05-01 DIAGNOSIS — E119 Type 2 diabetes mellitus without complications: Secondary | ICD-10-CM | POA: Diagnosis not present

## 2015-05-01 DIAGNOSIS — M1611 Unilateral primary osteoarthritis, right hip: Secondary | ICD-10-CM | POA: Diagnosis not present

## 2015-05-01 DIAGNOSIS — Z96642 Presence of left artificial hip joint: Secondary | ICD-10-CM | POA: Diagnosis present

## 2015-05-01 DIAGNOSIS — I10 Essential (primary) hypertension: Secondary | ICD-10-CM | POA: Diagnosis not present

## 2015-05-01 DIAGNOSIS — Z96641 Presence of right artificial hip joint: Secondary | ICD-10-CM | POA: Diagnosis not present

## 2015-05-01 DIAGNOSIS — Z794 Long term (current) use of insulin: Secondary | ICD-10-CM | POA: Diagnosis not present

## 2015-05-01 DIAGNOSIS — I739 Peripheral vascular disease, unspecified: Secondary | ICD-10-CM | POA: Diagnosis not present

## 2015-05-01 DIAGNOSIS — Z419 Encounter for procedure for purposes other than remedying health state, unspecified: Secondary | ICD-10-CM

## 2015-05-01 DIAGNOSIS — Z09 Encounter for follow-up examination after completed treatment for conditions other than malignant neoplasm: Secondary | ICD-10-CM

## 2015-05-01 DIAGNOSIS — Z96643 Presence of artificial hip joint, bilateral: Secondary | ICD-10-CM | POA: Diagnosis present

## 2015-05-01 DIAGNOSIS — Z471 Aftercare following joint replacement surgery: Secondary | ICD-10-CM | POA: Diagnosis not present

## 2015-05-01 DIAGNOSIS — Z79899 Other long term (current) drug therapy: Secondary | ICD-10-CM | POA: Diagnosis not present

## 2015-05-01 DIAGNOSIS — M25551 Pain in right hip: Secondary | ICD-10-CM | POA: Diagnosis present

## 2015-05-01 DIAGNOSIS — M169 Osteoarthritis of hip, unspecified: Secondary | ICD-10-CM | POA: Diagnosis not present

## 2015-05-01 HISTORY — PX: TOTAL HIP ARTHROPLASTY: SHX124

## 2015-05-01 HISTORY — DX: Nausea with vomiting, unspecified: R11.2

## 2015-05-01 HISTORY — DX: Other specified postprocedural states: Z98.890

## 2015-05-01 LAB — TYPE AND SCREEN
ABO/RH(D): AB POS
Antibody Screen: NEGATIVE

## 2015-05-01 LAB — GLUCOSE, CAPILLARY
GLUCOSE-CAPILLARY: 125 mg/dL — AB (ref 65–99)
Glucose-Capillary: 308 mg/dL — ABNORMAL HIGH (ref 65–99)
Glucose-Capillary: 247 mg/dL — ABNORMAL HIGH (ref 65–99)
Glucose-Capillary: 259 mg/dL — ABNORMAL HIGH (ref 65–99)

## 2015-05-01 LAB — ABO/RH: ABO/RH(D): AB POS

## 2015-05-01 SURGERY — ARTHROPLASTY, HIP, TOTAL, ANTERIOR APPROACH
Anesthesia: Monitor Anesthesia Care | Site: Hip | Laterality: Right

## 2015-05-01 MED ORDER — METOCLOPRAMIDE HCL 5 MG/ML IJ SOLN: 5.0000 mg | Freq: Three times a day (TID) | INTRAMUSCULAR | Status: DC | PRN

## 2015-05-01 MED ORDER — LEVOTHYROXINE SODIUM 75 MCG PO TABS
75.0000 ug | ORAL_TABLET | Freq: Every day | ORAL | Status: DC
  Administered 2015-05-02: 75 ug via ORAL
  Filled 2015-05-01: qty 1

## 2015-05-01 MED ORDER — FENTANYL CITRATE (PF) 250 MCG/5ML IJ SOLN
INTRAMUSCULAR | Status: AC
  Filled 2015-05-01: qty 5

## 2015-05-01 MED ORDER — OXYCODONE HCL 5 MG PO TABS: 5.0000 mg | ORAL_TABLET | Freq: Once | ORAL | Status: DC | PRN

## 2015-05-01 MED ORDER — INSULIN ASPART 100 UNIT/ML ~~LOC~~ SOLN
6.0000 [IU] | Freq: Every day | SUBCUTANEOUS | Status: DC
  Administered 2015-05-02: 6 [IU] via SUBCUTANEOUS

## 2015-05-01 MED ORDER — HYDROCHLOROTHIAZIDE 25 MG PO TABS
25.0000 mg | ORAL_TABLET | Freq: Every day | ORAL | Status: DC
  Filled 2015-05-01 (×2): qty 1

## 2015-05-01 MED ORDER — HYDROMORPHONE HCL 1 MG/ML IJ SOLN: 0.2500 mg | INTRAMUSCULAR | Status: DC | PRN

## 2015-05-01 MED ORDER — CHLORHEXIDINE GLUCONATE 4 % EX LIQD: 60.0000 mL | Freq: Once | CUTANEOUS | Status: DC

## 2015-05-01 MED ORDER — BENAZEPRIL HCL 20 MG PO TABS
20.0000 mg | ORAL_TABLET | Freq: Every day | ORAL | Status: DC
  Filled 2015-05-01 (×2): qty 1

## 2015-05-01 MED ORDER — ONDANSETRON HCL 4 MG/2ML IJ SOLN: 4.0000 mg | Freq: Four times a day (QID) | INTRAMUSCULAR | Status: DC | PRN

## 2015-05-01 MED ORDER — ONDANSETRON HCL 4 MG PO TABS: 4.0000 mg | ORAL_TABLET | Freq: Four times a day (QID) | ORAL | Status: DC | PRN

## 2015-05-01 MED ORDER — ACETAMINOPHEN 10 MG/ML IV SOLN
1000.0000 mg | INTRAVENOUS | Status: AC
  Administered 2015-05-01: 1000 mg via INTRAVENOUS
  Filled 2015-05-01: qty 100

## 2015-05-01 MED ORDER — PHENOL 1.4 % MT LIQD
1.0000 | OROMUCOSAL | Status: DC | PRN
Start: 2015-05-01 — End: 2015-05-02

## 2015-05-01 MED ORDER — INSULIN ASPART 100 UNIT/ML ~~LOC~~ SOLN
SUBCUTANEOUS | Status: AC
  Filled 2015-05-01: qty 1

## 2015-05-01 MED ORDER — MIDAZOLAM HCL 5 MG/5ML IJ SOLN
INTRAMUSCULAR | Status: DC | PRN
  Administered 2015-05-01 (×2): 0.5 mg via INTRAVENOUS
  Administered 2015-05-01: 1 mg via INTRAVENOUS

## 2015-05-01 MED ORDER — INSULIN ASPART 100 UNIT/ML ~~LOC~~ SOLN
0.0000 [IU] | Freq: Three times a day (TID) | SUBCUTANEOUS | Status: DC
Start: 2015-05-02 — End: 2015-05-02
  Administered 2015-05-02 (×2): 3 [IU] via SUBCUTANEOUS

## 2015-05-01 MED ORDER — ONDANSETRON HCL 4 MG/2ML IJ SOLN
INTRAMUSCULAR | Status: AC
  Filled 2015-05-01: qty 2

## 2015-05-01 MED ORDER — OXYCODONE HCL 5 MG/5ML PO SOLN: 5.0000 mg | Freq: Once | ORAL | Status: DC | PRN

## 2015-05-01 MED ORDER — PHENYLEPHRINE HCL 10 MG/ML IJ SOLN
10.0000 mg | INTRAVENOUS | Status: DC | PRN
  Administered 2015-05-01: 25 ug/min via INTRAVENOUS

## 2015-05-01 MED ORDER — MIDAZOLAM HCL 2 MG/2ML IJ SOLN
INTRAMUSCULAR | Status: AC
  Filled 2015-05-01: qty 2

## 2015-05-01 MED ORDER — SODIUM CHLORIDE 0.9 % IV SOLN: INTRAVENOUS | Status: DC

## 2015-05-01 MED ORDER — ONDANSETRON HCL 4 MG/2ML IJ SOLN
INTRAMUSCULAR | Status: AC
  Filled 2015-05-01: qty 4

## 2015-05-01 MED ORDER — GLYCOPYRROLATE 0.2 MG/ML IJ SOLN
INTRAMUSCULAR | Status: DC | PRN
  Administered 2015-05-01: 0.2 mg via INTRAVENOUS

## 2015-05-01 MED ORDER — ONDANSETRON HCL 4 MG/2ML IJ SOLN
INTRAMUSCULAR | Status: DC | PRN
  Administered 2015-05-01: 4 mg via INTRAVENOUS

## 2015-05-01 MED ORDER — LACTATED RINGERS IV SOLN
INTRAVENOUS | Status: DC | PRN
  Administered 2015-05-01 (×3): via INTRAVENOUS

## 2015-05-01 MED ORDER — DOCUSATE SODIUM 100 MG PO CAPS
100.0000 mg | ORAL_CAPSULE | Freq: Two times a day (BID) | ORAL | Status: DC
  Administered 2015-05-01 – 2015-05-02 (×2): 100 mg via ORAL
  Filled 2015-05-01 (×2): qty 1

## 2015-05-01 MED ORDER — SODIUM CHLORIDE 0.9 % IJ SOLN
INTRAMUSCULAR | Status: DC | PRN
  Administered 2015-05-01: 30 mL

## 2015-05-01 MED ORDER — SCOPOLAMINE 1 MG/3DAYS TD PT72
MEDICATED_PATCH | TRANSDERMAL | Status: AC
Start: 2015-05-01 — End: 2015-05-01
  Administered 2015-05-01: 1.5 mg
  Filled 2015-05-01: qty 1

## 2015-05-01 MED ORDER — PROPOFOL INFUSION 10 MG/ML OPTIME
INTRAVENOUS | Status: DC | PRN
  Administered 2015-05-01: 25 ug/kg/min via INTRAVENOUS

## 2015-05-01 MED ORDER — ONDANSETRON HCL 4 MG/2ML IJ SOLN
4.0000 mg | Freq: Four times a day (QID) | INTRAMUSCULAR | Status: AC | PRN
  Administered 2015-05-01: 4 mg via INTRAVENOUS

## 2015-05-01 MED ORDER — KETOROLAC TROMETHAMINE 15 MG/ML IJ SOLN
7.5000 mg | Freq: Four times a day (QID) | INTRAMUSCULAR | Status: AC
  Administered 2015-05-01 – 2015-05-02 (×4): 7.5 mg via INTRAVENOUS
  Filled 2015-05-01: qty 1

## 2015-05-01 MED ORDER — HYDROCODONE-ACETAMINOPHEN 5-325 MG PO TABS
1.0000 | ORAL_TABLET | ORAL | Status: DC | PRN
  Administered 2015-05-01 – 2015-05-02 (×2): 2 via ORAL
  Filled 2015-05-01 (×2): qty 2

## 2015-05-01 MED ORDER — TRAZODONE HCL 50 MG PO TABS: 150.0000 mg | ORAL_TABLET | Freq: Every evening | ORAL | Status: DC | PRN

## 2015-05-01 MED ORDER — ACETAMINOPHEN 650 MG RE SUPP: 650.0000 mg | Freq: Four times a day (QID) | RECTAL | Status: DC | PRN

## 2015-05-01 MED ORDER — KETOROLAC TROMETHAMINE 30 MG/ML IJ SOLN
INTRAMUSCULAR | Status: AC
  Filled 2015-05-01: qty 1

## 2015-05-01 MED ORDER — BUPIVACAINE HCL (PF) 0.5 % IJ SOLN
INTRAMUSCULAR | Status: DC | PRN
  Administered 2015-05-01: 3 mL via INTRATHECAL

## 2015-05-01 MED ORDER — FENTANYL CITRATE (PF) 100 MCG/2ML IJ SOLN
INTRAMUSCULAR | Status: DC | PRN
  Administered 2015-05-01: 50 ug via INTRAVENOUS

## 2015-05-01 MED ORDER — APIXABAN 2.5 MG PO TABS
2.5000 mg | ORAL_TABLET | Freq: Two times a day (BID) | ORAL | Status: DC
  Administered 2015-05-02: 2.5 mg via ORAL
  Filled 2015-05-01: qty 1

## 2015-05-01 MED ORDER — ACETAMINOPHEN 325 MG PO TABS: 650.0000 mg | ORAL_TABLET | Freq: Four times a day (QID) | ORAL | Status: DC | PRN

## 2015-05-01 MED ORDER — INSULIN ASPART 100 UNIT/ML ~~LOC~~ SOLN
6.0000 [IU] | Freq: Once | SUBCUTANEOUS | Status: AC
  Administered 2015-05-01: 6 [IU] via SUBCUTANEOUS

## 2015-05-01 MED ORDER — SODIUM CHLORIDE 0.9 % IR SOLN
Status: DC | PRN
  Administered 2015-05-01: 3000 mL

## 2015-05-01 MED ORDER — BENAZEPRIL-HYDROCHLOROTHIAZIDE 20-25 MG PO TABS: 1.0000 | ORAL_TABLET | Freq: Every day | ORAL | Status: DC

## 2015-05-01 MED ORDER — INSULIN GLARGINE 100 UNIT/ML ~~LOC~~ SOLN
25.0000 [IU] | Freq: Every morning | SUBCUTANEOUS | Status: DC
Start: 2015-05-02 — End: 2015-05-02
  Administered 2015-05-02: 25 [IU] via SUBCUTANEOUS
  Filled 2015-05-01: qty 0.25

## 2015-05-01 MED ORDER — CEFAZOLIN SODIUM-DEXTROSE 2-3 GM-% IV SOLR
2.0000 g | INTRAVENOUS | Status: AC
  Administered 2015-05-01: 2 g via INTRAVENOUS
  Filled 2015-05-01: qty 50

## 2015-05-01 MED ORDER — INSULIN ASPART 100 UNIT/ML ~~LOC~~ SOLN
10.0000 [IU] | Freq: Two times a day (BID) | SUBCUTANEOUS | Status: DC
  Administered 2015-05-02 (×2): 10 [IU] via SUBCUTANEOUS

## 2015-05-01 MED ORDER — ROCURONIUM BROMIDE 50 MG/5ML IV SOLN
INTRAVENOUS | Status: AC
  Filled 2015-05-01: qty 1

## 2015-05-01 MED ORDER — DEXAMETHASONE SODIUM PHOSPHATE 10 MG/ML IJ SOLN
10.0000 mg | Freq: Once | INTRAMUSCULAR | Status: AC
  Administered 2015-05-02: 10 mg via INTRAVENOUS
  Filled 2015-05-01: qty 1

## 2015-05-01 MED ORDER — CEFAZOLIN SODIUM 1-5 GM-% IV SOLN
1.0000 g | Freq: Four times a day (QID) | INTRAVENOUS | Status: AC
  Administered 2015-05-01 – 2015-05-02 (×2): 1 g via INTRAVENOUS
  Filled 2015-05-01 (×2): qty 50

## 2015-05-01 MED ORDER — METOCLOPRAMIDE HCL 5 MG PO TABS: 5.0000 mg | ORAL_TABLET | Freq: Three times a day (TID) | ORAL | Status: DC | PRN

## 2015-05-01 MED ORDER — SODIUM CHLORIDE 0.9 % IV SOLN
2000.0000 mg | INTRAVENOUS | Status: AC
  Administered 2015-05-01: 2000 mg via TOPICAL
  Filled 2015-05-01: qty 20

## 2015-05-01 MED ORDER — SODIUM CHLORIDE 0.9 % IV SOLN
INTRAVENOUS | Status: DC
  Administered 2015-05-01: 150 mL/h via INTRAVENOUS

## 2015-05-01 MED ORDER — PRAVASTATIN SODIUM 40 MG PO TABS
40.0000 mg | ORAL_TABLET | Freq: Every day | ORAL | Status: DC
  Filled 2015-05-01: qty 1

## 2015-05-01 MED ORDER — SENNA 8.6 MG PO TABS
2.0000 | ORAL_TABLET | Freq: Every day | ORAL | Status: DC
  Administered 2015-05-01: 17.2 mg via ORAL
  Filled 2015-05-01: qty 2

## 2015-05-01 MED ORDER — HYDROMORPHONE HCL 1 MG/ML IJ SOLN: 0.5000 mg | INTRAMUSCULAR | Status: DC | PRN

## 2015-05-01 MED ORDER — MENTHOL 3 MG MT LOZG: 1.0000 | LOZENGE | OROMUCOSAL | Status: DC | PRN

## 2015-05-01 MED ORDER — POVIDONE-IODINE 7.5 % EX SOLN
CUTANEOUS | Status: DC | PRN
  Administered 2015-05-01: 1 via TOPICAL

## 2015-05-01 MED ORDER — KETOROLAC TROMETHAMINE 30 MG/ML IJ SOLN
INTRAMUSCULAR | Status: DC | PRN
  Administered 2015-05-01: 30 mg via INTRA_ARTICULAR

## 2015-05-01 MED ORDER — BUPIVACAINE-EPINEPHRINE (PF) 0.5% -1:200000 IJ SOLN
INTRAMUSCULAR | Status: DC | PRN
  Administered 2015-05-01: 30 mL

## 2015-05-01 MED ORDER — BUPIVACAINE-EPINEPHRINE (PF) 0.5% -1:200000 IJ SOLN
INTRAMUSCULAR | Status: AC
  Filled 2015-05-01: qty 30

## 2015-05-01 MED ORDER — 0.9 % SODIUM CHLORIDE (POUR BTL) OPTIME
TOPICAL | Status: DC | PRN
  Administered 2015-05-01: 1000 mL

## 2015-05-01 SURGICAL SUPPLY — 55 items
BLADE SAW SGTL 18X1.27X75 (BLADE) IMPLANT
BLADE SAW SGTL 18X1.27X75MM (BLADE)
BLADE SURG ROTATE 9660 (MISCELLANEOUS) IMPLANT
CAPT HIP TOTAL 2 ×3 IMPLANT
CHLORAPREP W/TINT 26ML (MISCELLANEOUS) ×3 IMPLANT
CONT SPEC 4OZ CLIKSEAL STRL BL (MISCELLANEOUS) ×3 IMPLANT
COVER SURGICAL LIGHT HANDLE (MISCELLANEOUS) ×3 IMPLANT
DERMABOND ADHESIVE PROPEN (GAUZE/BANDAGES/DRESSINGS) ×2
DERMABOND ADVANCED (GAUZE/BANDAGES/DRESSINGS) ×2
DERMABOND ADVANCED .7 DNX12 (GAUZE/BANDAGES/DRESSINGS) ×1 IMPLANT
DERMABOND ADVANCED .7 DNX6 (GAUZE/BANDAGES/DRESSINGS) ×1 IMPLANT
DRAPE C-ARM 42X72 X-RAY (DRAPES) ×3 IMPLANT
DRAPE IMP U-DRAPE 54X76 (DRAPES) ×6 IMPLANT
DRAPE STERI IOBAN 125X83 (DRAPES) ×3 IMPLANT
DRAPE U-SHAPE 47X51 STRL (DRAPES) ×9 IMPLANT
DRSG AQUACEL AG ADV 3.5X10 (GAUZE/BANDAGES/DRESSINGS) ×3 IMPLANT
ELECT BLADE 4.0 EZ CLEAN MEGAD (MISCELLANEOUS) ×3
ELECT REM PT RETURN 9FT ADLT (ELECTROSURGICAL) ×3
ELECTRODE BLDE 4.0 EZ CLN MEGD (MISCELLANEOUS) ×1 IMPLANT
ELECTRODE REM PT RTRN 9FT ADLT (ELECTROSURGICAL) ×1 IMPLANT
EVACUATOR 1/8 PVC DRAIN (DRAIN) IMPLANT
GLOVE BIO SURGEON STRL SZ8.5 (GLOVE) ×6 IMPLANT
GLOVE BIOGEL PI IND STRL 8.5 (GLOVE) ×1 IMPLANT
GLOVE BIOGEL PI INDICATOR 8.5 (GLOVE) ×2
GOWN STRL REUS W/ TWL LRG LVL3 (GOWN DISPOSABLE) ×2 IMPLANT
GOWN STRL REUS W/TWL 2XL LVL3 (GOWN DISPOSABLE) ×3 IMPLANT
GOWN STRL REUS W/TWL LRG LVL3 (GOWN DISPOSABLE) ×4
HANDPIECE INTERPULSE COAX TIP (DISPOSABLE) ×2
HOOD PEEL AWAY FACE SHEILD DIS (HOOD) ×6 IMPLANT
KIT BASIN OR (CUSTOM PROCEDURE TRAY) ×3 IMPLANT
KIT ROOM TURNOVER OR (KITS) ×3 IMPLANT
MANIFOLD NEPTUNE II (INSTRUMENTS) ×3 IMPLANT
MARKER SKIN DUAL TIP RULER LAB (MISCELLANEOUS) ×3 IMPLANT
NEEDLE SPNL 18GX3.5 QUINCKE PK (NEEDLE) ×3 IMPLANT
NS IRRIG 1000ML POUR BTL (IV SOLUTION) ×3 IMPLANT
PACK TOTAL JOINT (CUSTOM PROCEDURE TRAY) ×3 IMPLANT
PACK UNIVERSAL I (CUSTOM PROCEDURE TRAY) ×3 IMPLANT
PAD ARMBOARD 7.5X6 YLW CONV (MISCELLANEOUS) ×6 IMPLANT
SAW OSC TIP CART 19.5X105X1.3 (SAW) ×3 IMPLANT
SEALER BIPOLAR AQUA 6.0 (INSTRUMENTS) ×3 IMPLANT
SET HNDPC FAN SPRY TIP SCT (DISPOSABLE) ×1 IMPLANT
SUCTION FRAZIER TIP 10 FR DISP (SUCTIONS) ×3 IMPLANT
SUT ETHIBOND NAB CT1 #1 30IN (SUTURE) ×6 IMPLANT
SUT MNCRL AB 3-0 PS2 18 (SUTURE) ×3 IMPLANT
SUT MON AB 2-0 CT1 36 (SUTURE) ×6 IMPLANT
SUT VIC AB 1 CT1 27 (SUTURE) ×2
SUT VIC AB 1 CT1 27XBRD ANBCTR (SUTURE) ×1 IMPLANT
SUT VIC AB 2-0 CT1 27 (SUTURE) ×4
SUT VIC AB 2-0 CT1 TAPERPNT 27 (SUTURE) ×2 IMPLANT
SUT VLOC 180 0 24IN GS25 (SUTURE) ×3 IMPLANT
SYR 50ML LL SCALE MARK (SYRINGE) ×3 IMPLANT
TOWEL OR 17X24 6PK STRL BLUE (TOWEL DISPOSABLE) ×3 IMPLANT
TOWEL OR 17X26 10 PK STRL BLUE (TOWEL DISPOSABLE) ×3 IMPLANT
TRAY FOLEY CATH 16FR SILVER (SET/KITS/TRAYS/PACK) ×3 IMPLANT
WATER STERILE IRR 1000ML POUR (IV SOLUTION) IMPLANT

## 2015-05-01 NOTE — Op Note (Signed)
OPERATIVE REPORT  SURGEON: Samson FredericBrian Aiko Belko, MD   ASSISTANT: Hart CarwinJustin Queen, RNFA.  PREOPERATIVE DIAGNOSIS: Right hip arthritis.   POSTOPERATIVE DIAGNOSIS: Right hip arthritis.   PROCEDURE: Right total hip arthroplasty, anterior approach.   IMPLANTS: DePuy Tri Lock stem, size 3, standard offset. DePuy Pinnacle Cup, size 52 mm. DePuy Altrx liner, size 52 by 32 mm. DePuy Biolox ceramic head ball, size 32 + 1.5 mm. 6.425mm cancellous screw x3.  ANESTHESIA:  Spinal  ESTIMATED BLOOD LOSS: 300 mL.  ANTIBIOTICS: 2g ancef.  DRAINS: None.  COMPLICATIONS: None.   CONDITION: PACU - hemodynamically stable.Marland Kitchen.   BRIEF CLINICAL NOTE: Lillard AnesWilma F Desroches is a 74 y.o. female with a long-standing history of Right hip arthritis. After failing conservative management, the patient was indicated for total hip arthroplasty. The risks, benefits, and alternatives to the procedure were explained, and the patient elected to proceed.  PROCEDURE IN DETAIL: Surgical site was marked by myself. Spinal anesthesia was obtained in the pre-op holding area. Once inside the operative room, a foley catheter was inserted. The patient was then positioned on the Hana table. All bony prominences were well padded. The hip was prepped and draped in the normal sterile surgical fashion. A time-out was called verifying side and site of surgery. The patient received IV antibiotics within 60 minutes of beginning the procedure.  The direct anterior approach to the hip was performed through the Hueter interval. Lateral femoral circumflex vessels were treated with the Auqumantys. The anterior capsule was exposed and an inverted T capsulotomy was made.The femoral neck cut was made to the level of the templated cut. A corkscrew was placed into the head and the head was removed. The femoral head was found to have eburnated bone and delamination of cartilage. The head was passed to the back table and was measured.  Acetabular  exposure was achieved, and the pulvinar and labrum were excised. Sequental reaming of the acetabulum was then performed up to a size 51 mm reamer. A 52 mm cup was then opened and impacted into place at approximately 40 degrees of abduction and 20 degrees of anteversion. Stability was excellent, and I chose to augment fixation with 6.5 mm cancellous screws x3.The final polyethylene liner was impacted into place and acetabular osteophytes were removed.   I then gained femoral exposure taking care to protect the abductors and greater trochanter. This was performed using standard external rotation, extension, and adduction. The capsule was peeled off the inner aspect of the greater trochanter, taking care to preserve the short external rotators. A cookie cutter was used to enter the femoral canal, and then the femoral canal finder was placed. Sequential broaching was performed up to a size 3. Calcar planer was used on the femoral neck remnant. I paced a std offset neck and a trial head ball. The hip was reduced. Leg lengths and offset were checked fluoroscopically. The hip was dislocated and trial components were removed. The final implants were placed, and the hip was reduced.  Fluoroscopy was used to confirm component position and leg lengths. At 90 degrees of external rotation and full extension, the hip was stable to an anterior directed force.  The wound was copiously irrigated with a dilute betadine solution followed by normal saline. Marcaine solution was injected into the periarticular soft tissue. The wound was closed in layers using #1 Vicryl and V-Loc for the fascia, 2-0 Vicryl for the subcutaneous fat, 2-0 Monocryl for the deep dermal layer, 3-0 running Monocryl subcuticular stitch, and Dermabond for the  skin. Once the glue was fully dried, an Aquacell Ag dressing was applied. The patient was transported to the recovery room in stable condition. Sponge, needle, and instrument counts were  correct at the end of the case x2. The patient tolerated the procedure well and there were no known complications.

## 2015-05-01 NOTE — Progress Notes (Signed)
Utilization review completed.  

## 2015-05-01 NOTE — Anesthesia Postprocedure Evaluation (Signed)
Anesthesia Post Note  Patient: Laura [name] AnesWilma F Chieffo  Procedure(s) Performed: Procedure(s) (LRB): RIGHT TOTAL HIP ARTHROPLASTY ANTERIOR APPROACH (Right)  Anesthesia type: general  Patient location: PACU  Post pain: Pain level controlled  Post assessment: Patient's Cardiovascular Status Stable  Last Vitals:  Filed Vitals:   05/01/15 1753  BP: 126/74  Pulse: 54  Temp: 36.4 C  Resp: 18    Post vital signs: Reviewed and stable  Level of consciousness: sedated  Complications: No apparent anesthesia complications

## 2015-05-01 NOTE — Anesthesia Procedure Notes (Signed)
Spinal Patient location during procedure: OR Staffing Anesthesiologist: Nolon Nations Performed by: anesthesiologist  Preanesthetic Checklist Completed: patient identified, site marked, surgical consent, pre-op evaluation, timeout performed, IV checked, risks and benefits discussed and monitors and equipment checked Spinal Block Patient position: sitting Prep: ChloraPrep Patient monitoring: heart rate, continuous pulse ox and blood pressure Approach: right paramedian Location: L2-3 Injection technique: single-shot Needle Needle type: Sprotte  Needle gauge: 24 G Needle length: 9 cm Additional Notes Expiration date of kit checked and confirmed. Patient tolerated procedure well, without complications.

## 2015-05-01 NOTE — Interval H&P Note (Signed)
History and Physical Interval Note:  05/01/2015 12:41 PM  [name]  has presented today for surgery, with the diagnosis of right hip osteoarthritis  The various methods of treatment have been discussed with the patient and family. After consideration of risks, benefits and other options for treatment, the patient has consented to  Procedure(s): RIGHT TOTAL HIP ARTHROPLASTY ANTERIOR APPROACH (Right) as a surgical intervention .  The patient's history has been reviewed, patient examined, no change in status, stable for surgery.  I have reviewed the patient's chart and labs.  Questions were answered to the patient's satisfaction.     Heddy Vidana, Cloyde ReamsBrian James

## 2015-05-01 NOTE — Transfer of Care (Signed)
Immediate Anesthesia Transfer of Care Note  Patient: Laura [name]  Procedure(s) Performed: Procedure(s): RIGHT TOTAL HIP ARTHROPLASTY ANTERIOR APPROACH (Right)  Patient Location: PACU  Anesthesia Type:Spinal  Level of Consciousness: awake, alert , oriented, patient cooperative and responds to stimulation  Airway & Oxygen Therapy: Patient Spontanous Breathing and Patient connected to nasal cannula oxygen  Post-op Assessment: Report given to RN, Post -op Vital signs reviewed and stable and Patient able to stick tongue midline  Post vital signs: stable  Last Vitals:  Filed Vitals:   05/01/15 0945  BP: 167/48  Pulse: 72  Temp: 36.4 C  Resp: 18    Complications: No apparent anesthesia complications

## 2015-05-01 NOTE — H&P (View-Only) (Signed)
TOTAL HIP ADMISSION H&P  Patient is admitted for right total hip arthroplasty.  Subjective:  Chief Complaint: right hip pain  HPI: Laura [name], 74 y.o. 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  Onset of symptoms was gradual starting 6 years ago with gradually worsening course since that time.The patient noted no past surgery on the right hip(s).  Patient currently rates pain in the right hip at 10 out of 10 with activity. Patient has night pain, worsening of pain with activity and weight bearing and pain that interfers with activities of daily living. Patient has evidence of subchondral cysts, subchondral sclerosis, periarticular osteophytes, joint space narrowing and collapse of the femoral head by imaging studies. This condition presents safety issues increasing the risk of falls. There is no current active infection.  Patient Active Problem List   Diagnosis Date Noted  . Atherosclerosis of native arteries of the extremities, unspecified 08/18/2013   Past Medical History  Diagnosis Date  . Hypertension   . Diabetes mellitus without complication   . Peripheral vascular disease   . Thyroid disease   . Arthritis   . Atherosclerosis of native arteries of the extremities, unspecified   . Embolism and thrombosis of iliac artery     Past Surgical History  Procedure Laterality Date  . Eye surgery Right 2010  . Colonoscopy  2012  . Thrombectomy  2001    RT GROIN  . Joint replacement Left 2010    Hip     (Not in a hospital admission) No Known Allergies  History  Substance Use Topics  . Smoking status: Former Smoker -- 1.00 packs/day for 20 years    Quit date: 04/21/2003  . Smokeless tobacco: Never Used  .  Alcohol Use: No    No family history on file.   Review of Systems  Constitutional: Negative.   HENT: Negative.   Eyes: Negative.   Respiratory: Negative.   Cardiovascular: Negative.   Gastrointestinal: Negative.   Genitourinary: Negative.   Musculoskeletal: Positive for joint pain.  Skin: Negative.   Neurological: Negative.   Endo/Heme/Allergies: Negative.   Psychiatric/Behavioral: Negative.     Objective:  Physical Exam  Vitals reviewed. Constitutional: She is oriented to person, place, and time. She appears well-developed and well-nourished.  HENT:  Head: Normocephalic and atraumatic.  Eyes: Conjunctivae and EOM are normal. Pupils are equal, round, and reactive to light.  Neck: Normal range of motion. Neck supple.  Cardiovascular: Normal rate, regular rhythm, normal heart sounds and intact distal pulses.   Respiratory: Effort normal and breath sounds normal.  GI: Soft. Bowel sounds are normal.  Genitourinary:  deferred  Musculoskeletal:       Right hip: She exhibits decreased range of motion.  Neurological: She is alert and oriented to person, place, and time. She has normal reflexes.  Skin: Skin is warm and dry.  Psychiatric: She has a normal mood and affect. Her behavior is normal. Judgment and thought content normal.    Vital signs in last 24 hours: @VSRANGES @  Labs:   Estimated body mass index is 22.50 kg/(m^2) as calculated from the following:   Height as of 04/21/15: 5\' 1"  (1.549 m).   Weight as of 04/21/15: 53.978 kg (119 lb).   Imaging Review Plain radiographs demonstrate severe  degenerative joint disease of the right hip(s). The bone quality appears to be adequate for age and reported activity level.  Assessment/Plan:  End stage arthritis, right hip(s)  The patient history, physical examination, clinical judgement of the provider and imaging studies are consistent with end stage degenerative joint disease of the right hip(s) and total hip  arthroplasty is deemed medically necessary. The treatment options including medical management, injection therapy, arthroscopy and arthroplasty were discussed at length. The risks and benefits of total hip arthroplasty were presented and reviewed. The risks due to aseptic loosening, infection, stiffness, dislocation/subluxation,  thromboembolic complications and other imponderables were discussed.  The patient acknowledged the explanation, agreed to proceed with the plan and consent was signed. Patient is being admitted for inpatient treatment for surgery, pain control, PT, OT, prophylactic antibiotics, VTE prophylaxis, progressive ambulation and ADL's and discharge planning.The patient is planning to be discharged home with home health services. Plan for apixiban.  

## 2015-05-02 ENCOUNTER — Encounter (HOSPITAL_COMMUNITY): Payer: Self-pay | Admitting: Orthopedic Surgery

## 2015-05-02 LAB — CBC
HCT: 27.9 % — ABNORMAL LOW (ref 36.0–46.0)
Hemoglobin: 9.4 g/dL — ABNORMAL LOW (ref 12.0–15.0)
MCH: 29.4 pg (ref 26.0–34.0)
MCHC: 33.7 g/dL (ref 30.0–36.0)
MCV: 87.2 fL (ref 78.0–100.0)
Platelets: 157 10*3/uL (ref 150–400)
RBC: 3.2 MIL/uL — AB (ref 3.87–5.11)
RDW: 13.1 % (ref 11.5–15.5)
WBC: 4.5 10*3/uL (ref 4.0–10.5)

## 2015-05-02 LAB — BASIC METABOLIC PANEL
Anion gap: 2 — ABNORMAL LOW (ref 5–15)
BUN: 14 mg/dL (ref 6–20)
CALCIUM: 7.7 mg/dL — AB (ref 8.9–10.3)
CO2: 25 mmol/L (ref 22–32)
Chloride: 106 mmol/L (ref 101–111)
Creatinine, Ser: 0.78 mg/dL (ref 0.44–1.00)
GFR calc Af Amer: >60 mL/min (ref >60)
GLUCOSE: 265 mg/dL — AB (ref 65–99)
POTASSIUM: 4.5 mmol/L (ref 3.5–5.1)
SODIUM: 133 mmol/L — AB (ref 135–145)

## 2015-05-02 LAB — GLUCOSE, CAPILLARY
GLUCOSE-CAPILLARY: 215 mg/dL — AB (ref 65–99)
GLUCOSE-CAPILLARY: 226 mg/dL — AB (ref 65–99)
GLUCOSE-CAPILLARY: 249 mg/dL — AB (ref 65–99)

## 2015-05-02 MED ORDER — SENNA 8.6 MG PO TABS: 2.0000 | ORAL_TABLET | Freq: Every day | ORAL | Status: DC

## 2015-05-02 MED ORDER — APIXABAN 2.5 MG PO TABS: 2.5000 mg | ORAL_TABLET | Freq: Two times a day (BID) | ORAL | Status: DC

## 2015-05-02 MED ORDER — HYDROCODONE-ACETAMINOPHEN 5-325 MG PO TABS: 1.0000 | ORAL_TABLET | ORAL | Status: DC | PRN

## 2015-05-02 MED ORDER — DOCUSATE SODIUM 100 MG PO CAPS
100.0000 mg | ORAL_CAPSULE | Freq: Two times a day (BID) | ORAL | Status: DC
Start: 2015-05-02 — End: 2015-07-11

## 2015-05-02 MED ORDER — ONDANSETRON HCL 4 MG PO TABS: 4.0000 mg | ORAL_TABLET | Freq: Four times a day (QID) | ORAL | Status: DC | PRN

## 2015-05-02 NOTE — Discharge Summary (Signed)
Physician Discharge Summary  Patient ID: ANALIAH DRUM MRN: [id number] DOB/AGE: 1941/08/19 74 y.o.  Admit date: 05/01/2015 Discharge date: 05/02/2015  Admission Diagnoses:  Primary osteoarthritis of right hip  Discharge Diagnoses:  Principal Problem:   Primary osteoarthritis of right hip   Past Medical History  Diagnosis Date  . Hypertension   . Diabetes mellitus without complication   . Peripheral vascular disease   . Thyroid disease   . Arthritis   . Atherosclerosis of native arteries of the extremities, unspecified   . Embolism and thrombosis of iliac artery   . PONV (postoperative nausea and vomiting)     Surgeries: Procedure(s): RIGHT TOTAL HIP ARTHROPLASTY ANTERIOR APPROACH on 05/01/2015   Consultants (if any):    Discharged Condition: Improved  Hospital Course: Laura [name] is an 74 y.o. female who was admitted 05/01/2015 with a diagnosis of Primary osteoarthritis of right hip and went to the operating room on 05/01/2015 and underwent the above named procedures.    She was given perioperative antibiotics:  Anti-infectives    Start     Dose/Rate Route Frequency Ordered Stop   05/01/15 1900  ceFAZolin (ANCEF) IVPB 1 g/50 mL premix     1 g 100 mL/hr over 30 Minutes Intravenous Every 6 hours 05/01/15 1825 05/02/15 0112   05/01/15 0750  ceFAZolin (ANCEF) IVPB 2 g/50 mL premix     2 g 100 mL/hr over 30 Minutes Intravenous On call to O.R. 05/01/15 0750 05/01/15 1253    .  She was given sequential compression devices, early ambulation, and apixiban for DVT prophylaxis.  She benefited maximally from the hospital stay and there were no complications.    Recent vital signs:  Filed Vitals:   05/02/15 0603  BP: 130/42  Pulse: 63  Temp: 97.8 F (36.6 C)  Resp: 18    Recent laboratory studies:  Lab Results  Component Value Date   HGB 12.6 04/21/2015   Lab Results  Component Value Date   WBC 4.9 04/21/2015   PLT 227 04/21/2015   Lab Results  Component  Value Date   INR 1.03 04/21/2015   Lab Results  Component Value Date   NA 140 04/21/2015   K 4.9 04/21/2015   CL 102 04/21/2015   CO2 30 04/21/2015   BUN 24* 04/21/2015   CREATININE 0.70 04/21/2015   GLUCOSE 82 04/21/2015    Discharge Medications:     Medication List    STOP taking these medications        aspirin 325 MG tablet     meloxicam 7.5 MG tablet  Commonly known as:  MOBIC      TAKE these medications        apixaban 2.5 MG Tabs tablet  Commonly known as:  ELIQUIS  Take 1 tablet (2.5 mg total) by mouth every 12 (twelve) hours.     B-12 5000 MCG Caps  Take 1 capsule by mouth daily.     benazepril-hydrochlorthiazide 20-25 MG per tablet  Commonly known as:  LOTENSIN HCT  Take 1 tablet by mouth daily.     docusate sodium 100 MG capsule  Commonly known as:  COLACE  Take 1 capsule (100 mg total) by mouth 2 (two) times daily.     glucose blood test strip  Commonly known as:  ACCU-CHEK AVIVA  Use as instructed 4x/day     HYDROcodone-acetaminophen 5-325 MG per tablet  Commonly known as:  NORCO/VICODIN  Take 1-2 tablets by mouth every 4 (four) hours as  needed (breakthrough pain).     insulin glargine 100 UNIT/ML injection  Commonly known as:  LANTUS  Inject 25 Units into the skin every morning.     insulin lispro 100 UNIT/ML injection  Commonly known as:  HUMALOG  Inject 6-10 Units into the skin 3 (three) times daily before meals. 6 units in the morning and 10 with lunch and dinner     levothyroxine 75 MCG tablet  Commonly known as:  SYNTHROID, LEVOTHROID  Take 75 mcg by mouth daily before breakfast.     NON FORMULARY  Take 2 tablets by mouth 2 (two) times daily. Juice plus. 2 red in the morning and 2 green at night     ondansetron 4 MG tablet  Commonly known as:  ZOFRAN  Take 1 tablet (4 mg total) by mouth every 6 (six) hours as needed for nausea.     pravastatin 40 MG tablet  Commonly known as:  PRAVACHOL  Take 40 mg by mouth daily.     senna  8.6 MG Tabs tablet  Commonly known as:  SENOKOT  Take 2 tablets (17.2 mg total) by mouth at bedtime.     traZODone 150 MG tablet  Commonly known as:  DESYREL  Take 150 mg by mouth at bedtime as needed for sleep.        Diagnostic Studies: Dg Pelvis Portable  05/01/2015   CLINICAL DATA:  Initial encounter for right hip replacement.  EXAM: PORTABLE PELVIS 1-2 VIEWS  COMPARISON:  None.  FINDINGS: AP view of the right hip shows the patient to be status post total hip replacement. No evidence for immediate hardware complications. Left hip prosthesis is evident. Bones are diffusely demineralized.  IMPRESSION: No evidence for immediate hardware complications after right total hip replacement.   Electronically Signed   By: Kennith CenterEric  Mansell M.D.   On: 05/01/2015 16:33   Dg Hip Operative Unilat With Pelvis Right  05/01/2015   CLINICAL DATA:  Right total hip arthroplasty  EXAM: OPERATIVE right HIP (WITH PELVIS IF PERFORMED) 2 VIEWS  TECHNIQUE: Fluoroscopic spot image(s) were submitted for interpretation post-operatively.  FLUOROSCOPY TIME:  Fluoroscopy Time:  43 seconds  Number of Acquired Images:  2  COMPARISON:  None.  FINDINGS: Two intraoperative fluoroscopic images during right total hip arthroplasty.  Associated surgical drain.  No fracture or dislocation is seen.  Prior left total hip arthroplasty.  IMPRESSION: Two intraoperative fluoroscopic images during right total hip arthroplasty.   Electronically Signed   By: Charline BillsSriyesh  Krishnan M.D.   On: 05/01/2015 15:33    Disposition:       Discharge Instructions    Call MD / Call 911    Complete by:  As directed   If you experience chest pain or shortness of breath, CALL 911 and be transported to the hospital emergency room.  If you develope a fever above 101 F, pus (white drainage) or increased drainage or redness at the wound, or calf pain, call your surgeon's office.     Constipation Prevention    Complete by:  As directed   Drink plenty of fluids.   Prune juice may be helpful.  You may use a stool softener, such as Colace (over the counter) 100 mg twice a day.  Use MiraLax (over the counter) for constipation as needed.     Diet - low sodium heart healthy    Complete by:  As directed      Driving restrictions    Complete by:  As directed  No driving for 6 weeks     Increase activity slowly as tolerated    Complete by:  As directed      Lifting restrictions    Complete by:  As directed   No lifting for 6 weeks     TED hose    Complete by:  As directed   Use stockings (TED hose) for 2 weeks on both leg(s).  You may remove them at night for sleeping.           Follow-up Information    Follow up with Carolanne Mercier, Cloyde Reams, MD. Schedule an appointment as soon as possible for a visit in 2 weeks.   Specialty:  Orthopedic Surgery   Why:  For wound re-check   Contact information:   [address] [place] [address] [phone]        Signed: Garnet Koyanagi 05/02/2015, 8:32 AM

## 2015-05-02 NOTE — Discharge Instructions (Signed)
°Dr. Brian Swinteck °Joint Replacement Specialist °[facility] °[address] °[place],  [address] °[phone] ° ° °TOTAL HIP REPLACEMENT POSTOPERATIVE DIRECTIONS ° ° ° °Hip Rehabilitation, Guidelines Following Surgery  ° °WEIGHT BEARING °Weight bearing as tolerated with assist device (walker, cane, etc) as directed, use it as long as suggested by your surgeon or therapist, typically at least 4-6 weeks. ° °The results of a hip operation are greatly improved after range of motion and muscle strengthening exercises. Follow all safety measures which are given to protect your hip. If any of these exercises cause increased pain or swelling in your joint, decrease the amount until you are comfortable again. Then slowly increase the exercises. Call your caregiver if you have problems or questions.  ° °HOME CARE INSTRUCTIONS  °Most of the following instructions are designed to prevent the dislocation of your new hip.  °Remove items at home which could result in a fall. This includes throw rugs or furniture in walking pathways.  °Continue medications as instructed at time of discharge. °· You may have some home medications which will be placed on hold until you complete the course of blood thinner medication. °· You may start showering once you are discharged home. Do not remove your dressing. °Do not put on socks or shoes without following the instructions of your caregivers.   °Sit on chairs with arms. Use the chair arms to help push yourself up when arising.  °Arrange for the use of a toilet seat elevator so you are not sitting low.  °· Walk with walker as instructed.  °You may resume a sexual relationship in one month or when given the OK by your caregiver.  °Use walker as long as suggested by your caregivers.  °You may put full weight on your legs and walk as much as is comfortable. °Avoid periods of inactivity such as sitting longer than an hour when not asleep. This helps prevent  blood clots.  °You may return to work once you are cleared by your surgeon.  °Do not drive a car for 6 weeks or until released by your surgeon.  °Do not drive while taking narcotics.  °Wear elastic stockings for two weeks following surgery during the day but you may remove then at night.  °Make sure you keep all of your appointments after your operation with all of your doctors and caregivers. You should call the office at the above phone number and make an appointment for approximately two weeks after the date of your surgery. °Please pick up a stool softener and laxative for home use as long as you are requiring pain medications. °· ICE to the affected hip every three hours for 30 minutes at a time and then as needed for pain and swelling. Continue to use ice on the hip for pain and swelling from surgery. You may notice swelling that will progress down to the foot and ankle.  This is normal after surgery.  Elevate the leg when you are not up walking on it.   °It is important for you to complete the blood thinner medication as prescribed by your doctor. °· Continue to use the breathing machine which will help keep your temperature down.  It is common for your temperature to cycle up and down following surgery, especially at night when you are not up moving around and exerting yourself.  The breathing machine keeps your lungs expanded and your temperature down. ° °RANGE OF MOTION AND STRENGTHENING EXERCISES  °These exercises are   designed to help you keep full movement of your hip joint. Follow your caregiver's or physical therapist's instructions. Perform all exercises about fifteen times, three times per day or as directed. Exercise both hips, even if you have had only one joint replacement. These exercises can be done on a training (exercise) mat, on the floor, on a table or on a bed. Use whatever works the best and is most comfortable for you. Use music or television while you are exercising so that the exercises  are a pleasant break in your day. This will make your life better with the exercises acting as a break in routine you can look forward to.  Lying on your back, slowly slide your foot toward your buttocks, raising your knee up off the floor. Then slowly slide your foot back down until your leg is straight again.  Lying on your back spread your legs as far apart as you can without causing discomfort.  Lying on your side, raise your upper leg and foot straight up from the floor as far as is comfortable. Slowly lower the leg and repeat.  Lying on your back, tighten up the muscle in the front of your thigh (quadriceps muscles). You can do this by keeping your leg straight and trying to raise your heel off the floor. This helps strengthen the largest muscle supporting your knee.  Lying on your back, tighten up the muscles of your buttocks both with the legs straight and with the knee bent at a comfortable angle while keeping your heel on the floor.   SKILLED REHAB INSTRUCTIONS: If the patient is transferred to a skilled rehab facility following release from the hospital, a list of the current medications will be sent to the facility for the patient to continue.  When discharged from the skilled rehab facility, please have the facility set up the patient's Home Health Physical Therapy prior to being released. Also, the skilled facility will be responsible for providing the patient with their medications at time of release from the facility to include their pain medication and their blood thinner medication. If the patient is still at the rehab facility at time of the two week follow up appointment, the skilled rehab facility will also need to assist the patient in arranging follow up appointment in our office and any transportation needs.  MAKE SURE YOU:  Understand these instructions.  Will watch your condition.  Will get help right away if you are not doing well or get worse.  Pick up stool softner and  laxative for home use following surgery while on pain medications. Do not remove your dressing. The dressing is waterproof--it is OK to take showers. Continue to use ice for pain and swelling after surgery. Do not use any lotions or creams on the incision until instructed by your surgeon. Total Hip Protocol.  Information on my medicine - ELIQUIS (apixaban)  This medication education was reviewed with me or my healthcare representative as part of my discharge preparation.  The pharmacist that spoke with me during my hospital stay was:  Wilhemina BonitoLee, Kaliopi Blyden C, [facility]  Why was Eliquis prescribed for you? Eliquis was prescribed for you to reduce the risk of blood clots forming after orthopedic surgery.    What do You need to know about Eliquis? Take your Eliquis TWICE DAILY - one tablet in the morning and one tablet in the evening with or without food.  It would be best to take the dose about the same time each day.  If you have difficulty swallowing the tablet whole please discuss with your pharmacist how to take the medication safely.  Take Eliquis exactly as prescribed by your doctor and DO NOT stop taking Eliquis without talking to the doctor who prescribed the medication.  Stopping without other medication to take the place of Eliquis may increase your risk of developing a clot.  After discharge, you should have regular check-up appointments with your healthcare provider that is prescribing your Eliquis.  What do you do if you miss a dose? If a dose of ELIQUIS is not taken at the scheduled time, take it as soon as possible on the same day and twice-daily administration should be resumed.  The dose should not be doubled to make up for a missed dose.  Do not take more than one tablet of ELIQUIS at the same time.  Important Safety Information A possible side effect of Eliquis is bleeding. You should call your healthcare provider right away if you experience any of the following: ? Bleeding  from an injury or your nose that does not stop. ? Unusual colored urine (red or dark brown) or unusual colored stools (red or black). ? Unusual bruising for unknown reasons. ? A serious fall or if you hit your head (even if there is no bleeding).  Some medicines may interact with Eliquis and might increase your risk of bleeding or clotting while on Eliquis. To help avoid this, consult your healthcare provider or pharmacist prior to using any new prescription or non-prescription medications, including herbals, vitamins, non-steroidal anti-inflammatory drugs (NSAIDs) and supplements.  This website has more information on Eliquis (apixaban): http://www.eliquis.com/eliquis/home

## 2015-05-02 NOTE — Progress Notes (Signed)
Occupational Therapy Evaluation Patient Details Name: KALEEYAH CUFFIE MRN: [id number] DOB: June 08, 1941 Today's Date: 05/02/2015    History of Present Illness s/p ant THA   Clinical Impression   Excellent progress. Pt completed ADL and functional mobility for ADL with RW @ S level. Completed all education with pt and family. Pt ready to D/C home with family from OT standpoint when medically stable. OT signing off. Thanks.    Follow Up Recommendations  No OT follow up;Supervision - Intermittent    Equipment Recommendations  None recommended by OT    Recommendations for Other Services       Precautions / Restrictions Precautions Precautions: None Restrictions RLE Weight Bearing: Weight bearing as tolerated      Mobility Bed Mobility Overal bed mobility: Modified Independent             General bed mobility comments: using rail  Transfers Overall transfer level: Needs assistance Equipment used: Rolling walker (2 wheeled) Transfers: Sit to/from Stand;Stand Pivot Transfers Sit to Stand: Supervision Stand pivot transfers: Supervision       General transfer comment: vc for hand placement    Balance Overall balance assessment: Needs assistance   Sitting balance-Leahy Scale: Good     Standing balance support: During functional activity Standing balance-Leahy Scale: Fair                              ADL Overall ADL's : Needs assistance/impaired     Grooming: Modified independent   Upper Body Bathing: Set up   Lower Body Bathing: Minimal assistance   Upper Body Dressing : Supervision/safety   Lower Body Dressing: Minimal assistance   Toilet Transfer: Supervision/safety   Toileting- Clothing Manipulation and Hygiene: Supervision/safety;Sit to/from stand   Tub/ Shower Transfer: Walk-in shower;Supervision/safety;Cueing for safety;Cueing for sequencing;Ambulation;3 in 1;Rolling walker   Functional mobility during ADLs:  Supervision/safety;Rolling walker;Cueing for sequencing General ADL Comments: Completed educaiton regarding compensatory techniques for ADL and functional mobility @ RW level. Family will be able ot assist as needed. Family present for educaiton on shower transfer technique. Written information givne.                      Pertinent Vitals/Pain Pain Assessment: 0-10 Pain Score: 7  Pain Location: R hip Pain Descriptors / Indicators: Aching;Burning Pain Intervention(s): Limited activity within patient's tolerance;Monitored during session;Repositioned;Ice applied;Patient requesting pain meds-RN notified     Hand Dominance Right   Extremity/Trunk Assessment Upper Extremity Assessment Upper Extremity Assessment: Overall WFL for tasks assessed   Lower Extremity Assessment Lower Extremity Assessment: Defer to PT evaluation   Cervical / Trunk Assessment Cervical / Trunk Assessment: Normal   Communication Communication Communication: No difficulties   Cognition Arousal/Alertness: Awake/alert Behavior During Therapy: WFL for tasks assessed/performed Overall Cognitive Status: Within Functional Limits for tasks assessed                     General Comments       Exercises       Shoulder Instructions      Home Living Family/patient expects to be discharged to:: Private residence Living Arrangements: Spouse/significant other;Children Available Help at Discharge: Family;Available 24 hours/day Type of Home: House Home Access: Ramped entrance     Home Layout: One level     Bathroom Shower/Tub: Producer, television/film/video: Standard Bathroom Accessibility: Yes How Accessible: Accessible via walker Home Equipment: Walker - 2 wheels;Bedside commode;Shower seat;Grab  bars - tub/shower;Grab bars - toilet;Wheelchair - Civil engineer, contractingmanual;Adaptive equipment Adaptive Equipment: Reacher        Prior Functioning/Environment Level of Independence: Independent;Independent with  assistive device(s)        Comments: uses [place] if needed    OT Diagnosis: Generalized weakness;Acute pain   OT Problem List: Decreased strength;Decreased range of motion;Decreased activity tolerance;Decreased knowledge of use of DME or AE;Pain   OT Treatment/Interventions:      OT Goals(Current goals can be found in the care plan section) Acute Rehab OT Goals Patient Stated Goal: to go home OT Goal Formulation: All assessment and education complete, DC therapy  OT Frequency:     Barriers to D/C:            Co-evaluation              End of Session Equipment Utilized During Treatment: Gait belt;Rolling walker Nurse Communication: Mobility status  Activity Tolerance: Patient tolerated treatment well Patient left: in chair;with call bell/phone within reach;with family/visitor present   Time: [id number] OT Time Calculation (min): 23 min Charges:  OT General Charges $OT Visit: 1 Procedure OT Evaluation $Initial OT Evaluation Tier I: 1 Procedure OT Treatments $Self Care/Home Management : 8-22 mins G-Codes:    Rosilyn Coachman,HILLARY 05/02/2015, 10:07 AM   Luisa DagoHilary Terri Rorrer, OTR/L  [phone] 05/02/2015

## 2015-05-02 NOTE — Progress Notes (Signed)
   Subjective:  Patient reports pain as mild.  Denies N/V/CP/SOB.  Objective:   VITALS:   Filed Vitals:   05/01/15 1753 05/01/15 2137 05/02/15 0037 05/02/15 0603  BP: 126/74 122/42 115/34 130/42  Pulse: 54 70 56 63  Temp: 97.5 F (36.4 C) 98.1 F (36.7 C) 98.2 F (36.8 C) 97.8 F (36.6 C)  TempSrc:  Oral  Oral  Resp: 18 18 17 18   Height:      Weight:      SpO2: 100% 100% 100% 99%    ABD soft Sensation intact distally Intact pulses distally Dorsiflexion/Plantar flexion intact Incision: dressing C/D/I Compartment soft   Lab Results  Component Value Date   WBC 4.9 04/21/2015   HGB 12.6 04/21/2015   HCT 38.2 04/21/2015   MCV 88.8 04/21/2015   PLT 227 04/21/2015   BMET    Component Value Date/Time   NA 140 04/21/2015 1050   K 4.9 04/21/2015 1050   CL 102 04/21/2015 1050   CO2 30 04/21/2015 1050   GLUCOSE 82 04/21/2015 1050   BUN 24* 04/21/2015 1050   CREATININE 0.70 04/21/2015 1050   CALCIUM 10.1 04/21/2015 1050   GFRNONAA >60 04/21/2015 1050   GFRAA >60 04/21/2015 1050     Assessment/Plan: 1 Day Post-Op   Principal Problem:   Primary osteoarthritis of right hip   WBAT with walker PT/OT PO pain control DVT ppx: apixiban, SCDs, TEDs Advance diet Up with therapy D/C IV fluids Discharge home with home health    Kambrey Hagger, Cloyde ReamsBrian James 05/02/2015, 8:26 AM   Samson FredericBrian James Senn, MD Cell [phone]

## 2015-05-02 NOTE — Evaluation (Signed)
Physical Therapy Evaluation Patient Details Name: Laura [name] MRN: [id number] DOB: 08/13/1941 Today's Date: 05/02/2015   History of Present Illness  s/p ant THA  Clinical Impression  Pt is s/p THA resulting in the deficits listed below (see PT Problem List). Pt will benefit from skilled PT to increase their independence and safety with mobility to allow discharge home with family assist. Patient mobilized very well including ambulation and stairs. No barriers to D/C at this time.      Follow Up Recommendations Home health PT    Equipment Recommendations  None recommended by PT    Recommendations for Other Services       Precautions / Restrictions Precautions Precautions: None Restrictions Weight Bearing Restrictions: Yes RLE Weight Bearing: Weight bearing as tolerated      Mobility  Bed Mobility Overal bed mobility: Modified Independent             General bed mobility comments: using rail  Transfers Overall transfer level: Needs assistance Equipment used: Rolling walker (2 wheeled) Transfers: Sit to/from Stand Sit to Stand: Supervision Stand pivot transfers: Supervision       General transfer comment: vc for hand placement  Ambulation/Gait Ambulation/Gait assistance: Supervision Ambulation Distance (Feet): 100 Feet Assistive device: Rolling walker (2 wheeled) Gait Pattern/deviations: Step-to pattern;Step-through pattern (starting with step to pattern and progressing to step throug)   Gait velocity interpretation: Below normal speed for age/gender    Stairs Stairs: Yes Stairs assistance: Supervision Stair Management: One rail Right;Step to pattern Number of Stairs:  (2)    Wheelchair Mobility    Modified Rankin (Stroke Patients Only)       Balance Overall balance assessment: Needs assistance   Sitting balance-Leahy Scale: Good     Standing balance support: Bilateral upper extremity supported Standing balance-Leahy Scale: Fair                                Pertinent Vitals/Pain Pain Assessment: 0-10 Pain Score: 0-No pain Pain Location: R hip Pain Descriptors / Indicators: Aching;Burning Pain Intervention(s): Limited activity within patient's tolerance;Monitored during session;Repositioned;Ice applied;Patient requesting pain meds-RN notified    Home Living Family/patient expects to be discharged to:: Private residence Living Arrangements: Spouse/significant other Available Help at Discharge: Family Type of Home: House Home Access: Stairs to enter;Ramped entrance   Entrance Stairs-Number of Steps: 1 Home Layout: One level Home Equipment: Environmental consultantWalker - 2 wheels      Prior Function Level of Independence: Independent         Comments: uses [place] if needed     Hand Dominance   Dominant Hand: Right    Extremity/Trunk Assessment   Upper Extremity Assessment: Overall WFL for tasks assessed           Lower Extremity Assessment: Overall WFL for tasks assessed      Cervical / Trunk Assessment: Normal  Communication   Communication: No difficulties  Cognition Arousal/Alertness: Awake/alert Behavior During Therapy: WFL for tasks assessed/performed Overall Cognitive Status: Within Functional Limits for tasks assessed                      General Comments      Exercises Total Joint Exercises Ankle Circles/Pumps: AROM;Both;15 reps Quad Sets: Right;Strengthening;10 reps Short Arc Quad: Strengthening;Right;10 reps Heel Slides: AROM;Right;5 reps      Assessment/Plan    PT Assessment Patient needs continued PT services  PT Diagnosis Difficulty walking;Generalized weakness  PT Problem List Decreased strength;Decreased activity tolerance;Decreased balance  PT Treatment Interventions Gait training;Stair training;Functional mobility training;Therapeutic activities;Therapeutic exercise;Patient/family education   PT Goals (Current goals can be found in the Care Plan section) Acute  Rehab PT Goals Patient Stated Goal: Be able to move around with less pain.  PT Goal Formulation: With patient Time For Goal Achievement: 05/16/15 Potential to Achieve Goals: Good    Frequency 7X/week   Barriers to discharge        Co-evaluation               End of Session Equipment Utilized During Treatment: Gait belt Activity Tolerance: Patient tolerated treatment well Patient left: in chair;with call bell/phone within reach;with family/visitor present           Time: 1610-9604 PT Time Calculation (min) (ACUTE ONLY): 31 min   Charges:   PT Evaluation $Initial PT Evaluation Tier I: 1 Procedure PT Treatments $Gait Training: 8-22 mins   PT G Codes:        Christiane Ha, PT, CSCS Pager [phone] Office [phone]  05/02/2015, 11:34 AM

## 2015-05-02 NOTE — Care Management Note (Signed)
Case Management Note  Patient Details  Name: Laura [name] MRN: [id number] Date of Birth: Apr 26, 1941  Subjective/Objective:            S/p right total hip arthroplasty        Action/Plan[facility] MD office. Spoke with patient and her daughter, she will be [address] her [id number] [place] Contacted Laren Whaling with [facility], they would not be able to service the patient at this address. [facility] chose [facility]. Contacted [facility] at [facility] and set up HHPT. Patient stated that has a rolling walker and a 3N1, no equipment needs identified. Patient states that she will have family available to assist after discharge.   Expected Discharge Date:                  Expected Discharge Plan:  Home w Home Health Services  In-House Referral:  NA  Discharge planning Services  CM Consult  Post Acute Care Choice:  Home Health Choice offered to:  Patient  DME Arranged:    DME Agency:     HH Arranged:  PT HH Agency:  [facility]  Status of Service:  Completed, signed off  Medicare Important Message Given:    Date Medicare IM Given:    Medicare IM give by:    Date Additional Medicare IM Given:    Additional Medicare Important Message give by:     If discussed at Long Length of Stay Meetings, dates discussed:    Additional Comments:  [name], [name], RN 05/02/2015, 12:57 PM

## 2015-05-02 NOTE — Progress Notes (Signed)
NURSING PROGRESS NOTE  [name] Felker [id number] Discharge Data: 05/02/2015 4:43 PM Attending Provider: Samson FredericBrian Swinteck, MD NWG:NFAOZHPCP:Ashany Sherley BoundsSundaram, MD     [name] Runk to be D/C'd Home per MD order with homehealth.  Discussed with the patient the After Visit Summary and all questions fully answered. All IV's discontinued with no bleeding noted. All belongings returned to patient for patient to take home.   Last Vital Signs:  Blood pressure 130/42, pulse 63, temperature 97.8 [name] (36.6 C), temperature source Oral, resp. rate 18, height 5\' 1"  (1.549 m), weight 53.978 kg (119 lb), SpO2 99 %.  Discharge Medication List   Medication List    STOP taking these medications        aspirin 325 MG tablet     meloxicam 7.5 MG tablet  Commonly known as:  MOBIC      TAKE these medications        apixaban 2.5 MG Tabs tablet  Commonly known as:  ELIQUIS  Take 1 tablet (2.5 mg total) by mouth every 12 (twelve) hours.     B-12 5000 MCG Caps  Take 1 capsule by mouth daily.     benazepril-hydrochlorthiazide 20-25 MG per tablet  Commonly known as:  LOTENSIN HCT  Take 1 tablet by mouth daily.     docusate sodium 100 MG capsule  Commonly known as:  COLACE  Take 1 capsule (100 mg total) by mouth 2 (two) times daily.     glucose blood test strip  Commonly known as:  ACCU-CHEK AVIVA  Use as instructed 4x/day     HYDROcodone-acetaminophen 5-325 MG per tablet  Commonly known as:  NORCO/VICODIN  Take 1-2 tablets by mouth every 4 (four) hours as needed (breakthrough pain).     insulin glargine 100 UNIT/ML injection  Commonly known as:  LANTUS  Inject 25 Units into the skin every morning.     insulin lispro 100 UNIT/ML injection  Commonly known as:  HUMALOG  Inject 6-10 Units into the skin 3 (three) times daily before meals. 6 units in the morning and 10 with lunch and dinner     levothyroxine 75 MCG tablet  Commonly known as:  SYNTHROID, LEVOTHROID  Take 75 mcg by mouth daily before  breakfast.     NON FORMULARY  Take 2 tablets by mouth 2 (two) times daily. Juice plus. 2 red in the morning and 2 green at night     ondansetron 4 MG tablet  Commonly known as:  ZOFRAN  Take 1 tablet (4 mg total) by mouth every 6 (six) hours as needed for nausea.     pravastatin 40 MG tablet  Commonly known as:  PRAVACHOL  Take 40 mg by mouth daily.     senna 8.6 MG Tabs tablet  Commonly known as:  SENOKOT  Take 2 tablets (17.2 mg total) by mouth at bedtime.     traZODone 150 MG tablet  Commonly known as:  DESYREL  Take 150 mg by mouth at bedtime as needed for sleep.

## 2015-05-03 DIAGNOSIS — I2510 Atherosclerotic heart disease of native coronary artery without angina pectoris: Secondary | ICD-10-CM | POA: Diagnosis not present

## 2015-05-03 DIAGNOSIS — I10 Essential (primary) hypertension: Secondary | ICD-10-CM | POA: Diagnosis not present

## 2015-05-03 DIAGNOSIS — Z96641 Presence of right artificial hip joint: Secondary | ICD-10-CM | POA: Diagnosis not present

## 2015-05-03 DIAGNOSIS — Z471 Aftercare following joint replacement surgery: Secondary | ICD-10-CM | POA: Diagnosis not present

## 2015-05-03 DIAGNOSIS — E039 Hypothyroidism, unspecified: Secondary | ICD-10-CM | POA: Diagnosis not present

## 2015-05-03 DIAGNOSIS — Z7901 Long term (current) use of anticoagulants: Secondary | ICD-10-CM | POA: Diagnosis not present

## 2015-05-03 DIAGNOSIS — Z794 Long term (current) use of insulin: Secondary | ICD-10-CM | POA: Diagnosis not present

## 2015-05-03 DIAGNOSIS — I739 Peripheral vascular disease, unspecified: Secondary | ICD-10-CM | POA: Diagnosis not present

## 2015-05-03 DIAGNOSIS — E119 Type 2 diabetes mellitus without complications: Secondary | ICD-10-CM | POA: Diagnosis not present

## 2015-05-04 DIAGNOSIS — Z471 Aftercare following joint replacement surgery: Secondary | ICD-10-CM | POA: Diagnosis not present

## 2015-05-04 DIAGNOSIS — I739 Peripheral vascular disease, unspecified: Secondary | ICD-10-CM | POA: Diagnosis not present

## 2015-05-04 DIAGNOSIS — E119 Type 2 diabetes mellitus without complications: Secondary | ICD-10-CM | POA: Diagnosis not present

## 2015-05-04 DIAGNOSIS — Z96641 Presence of right artificial hip joint: Secondary | ICD-10-CM | POA: Diagnosis not present

## 2015-05-04 DIAGNOSIS — I2510 Atherosclerotic heart disease of native coronary artery without angina pectoris: Secondary | ICD-10-CM | POA: Diagnosis not present

## 2015-05-04 DIAGNOSIS — Z7901 Long term (current) use of anticoagulants: Secondary | ICD-10-CM | POA: Diagnosis not present

## 2015-05-04 DIAGNOSIS — Z794 Long term (current) use of insulin: Secondary | ICD-10-CM | POA: Diagnosis not present

## 2015-05-04 DIAGNOSIS — E039 Hypothyroidism, unspecified: Secondary | ICD-10-CM | POA: Diagnosis not present

## 2015-05-04 DIAGNOSIS — I10 Essential (primary) hypertension: Secondary | ICD-10-CM | POA: Diagnosis not present

## 2015-05-08 DIAGNOSIS — Z471 Aftercare following joint replacement surgery: Secondary | ICD-10-CM | POA: Diagnosis not present

## 2015-05-08 DIAGNOSIS — I739 Peripheral vascular disease, unspecified: Secondary | ICD-10-CM | POA: Diagnosis not present

## 2015-05-08 DIAGNOSIS — E119 Type 2 diabetes mellitus without complications: Secondary | ICD-10-CM | POA: Diagnosis not present

## 2015-05-08 DIAGNOSIS — I2510 Atherosclerotic heart disease of native coronary artery without angina pectoris: Secondary | ICD-10-CM | POA: Diagnosis not present

## 2015-05-08 DIAGNOSIS — I10 Essential (primary) hypertension: Secondary | ICD-10-CM | POA: Diagnosis not present

## 2015-05-08 DIAGNOSIS — Z7901 Long term (current) use of anticoagulants: Secondary | ICD-10-CM | POA: Diagnosis not present

## 2015-05-08 DIAGNOSIS — Z96641 Presence of right artificial hip joint: Secondary | ICD-10-CM | POA: Diagnosis not present

## 2015-05-08 DIAGNOSIS — E039 Hypothyroidism, unspecified: Secondary | ICD-10-CM | POA: Diagnosis not present

## 2015-05-08 DIAGNOSIS — Z794 Long term (current) use of insulin: Secondary | ICD-10-CM | POA: Diagnosis not present

## 2015-05-10 ENCOUNTER — Telehealth: Payer: Self-pay | Admitting: Family Medicine

## 2015-05-10 NOTE — Telephone Encounter (Signed)
PT DAUGHTER SAID THAT PT HAD HIP SURGERY ON June 27TH AND HER BLOOD PRESSURE IS RUNNING VERY LOW LIKE 112/35,  96/47 ETC. IT STARTED GRADUALY COMING UP. YESTERDAY IT WAS 144/78 BUT THEN THIS MORNING IT IS BACK DOWN TO 105/57. SHE JUST DOES NOT KNOW IF SHE NEEDS TO GIVE HER THE BLOOD PRESSURE MEDS WITH HER PRESSURE RUNNING SO LOW. DAUGHTER IS VERY CONCERNED WITH GIVING IT TO HER WITH IT KEEP GOING SO LOW AT TIMES. THE PT PERSON CAME OUT AND EVEN MENTIONED THAT HER PRESSURE IS RUNNING VERY LOW. PLEASE ADVISE.

## 2015-05-10 NOTE — Telephone Encounter (Signed)
Contacted this patient's daughter Agustin Cree(Darlene) to relay the message from Dr. Alba CoryKrichna Sowles. After she verified her mom's date of birth, the new instructions was reviewed. Agustin CreeDarlene (patient's daughter) repeated the instructions back to me to make sure she wrote it down correctly and confirmed her appt for 05/17/15.

## 2015-05-10 NOTE — Telephone Encounter (Signed)
Tell her to hold medication if bp below 120/75 when she wakes up, but can take half pill if above that ( lisinopril Laura [name]/hctz)

## 2015-05-11 DIAGNOSIS — I10 Essential (primary) hypertension: Secondary | ICD-10-CM | POA: Diagnosis not present

## 2015-05-11 DIAGNOSIS — E039 Hypothyroidism, unspecified: Secondary | ICD-10-CM | POA: Diagnosis not present

## 2015-05-11 DIAGNOSIS — Z96641 Presence of right artificial hip joint: Secondary | ICD-10-CM | POA: Diagnosis not present

## 2015-05-11 DIAGNOSIS — I739 Peripheral vascular disease, unspecified: Secondary | ICD-10-CM | POA: Diagnosis not present

## 2015-05-11 DIAGNOSIS — Z794 Long term (current) use of insulin: Secondary | ICD-10-CM | POA: Diagnosis not present

## 2015-05-11 DIAGNOSIS — I2510 Atherosclerotic heart disease of native coronary artery without angina pectoris: Secondary | ICD-10-CM | POA: Diagnosis not present

## 2015-05-11 DIAGNOSIS — E119 Type 2 diabetes mellitus without complications: Secondary | ICD-10-CM | POA: Diagnosis not present

## 2015-05-11 DIAGNOSIS — Z471 Aftercare following joint replacement surgery: Secondary | ICD-10-CM | POA: Diagnosis not present

## 2015-05-11 DIAGNOSIS — Z7901 Long term (current) use of anticoagulants: Secondary | ICD-10-CM | POA: Diagnosis not present

## 2015-05-16 DIAGNOSIS — Z471 Aftercare following joint replacement surgery: Secondary | ICD-10-CM | POA: Diagnosis not present

## 2015-05-16 DIAGNOSIS — Z96641 Presence of right artificial hip joint: Secondary | ICD-10-CM | POA: Diagnosis not present

## 2015-05-17 ENCOUNTER — Encounter: Payer: Self-pay | Admitting: Family Medicine

## 2015-05-17 DIAGNOSIS — Z471 Aftercare following joint replacement surgery: Secondary | ICD-10-CM | POA: Diagnosis not present

## 2015-05-17 DIAGNOSIS — I739 Peripheral vascular disease, unspecified: Secondary | ICD-10-CM | POA: Diagnosis not present

## 2015-05-17 DIAGNOSIS — E119 Type 2 diabetes mellitus without complications: Secondary | ICD-10-CM | POA: Diagnosis not present

## 2015-05-17 DIAGNOSIS — Z96641 Presence of right artificial hip joint: Secondary | ICD-10-CM | POA: Diagnosis not present

## 2015-05-17 DIAGNOSIS — I2510 Atherosclerotic heart disease of native coronary artery without angina pectoris: Secondary | ICD-10-CM | POA: Diagnosis not present

## 2015-05-17 DIAGNOSIS — I10 Essential (primary) hypertension: Secondary | ICD-10-CM | POA: Diagnosis not present

## 2015-05-17 DIAGNOSIS — E039 Hypothyroidism, unspecified: Secondary | ICD-10-CM | POA: Diagnosis not present

## 2015-05-17 DIAGNOSIS — Z7901 Long term (current) use of anticoagulants: Secondary | ICD-10-CM | POA: Diagnosis not present

## 2015-05-17 DIAGNOSIS — Z794 Long term (current) use of insulin: Secondary | ICD-10-CM | POA: Diagnosis not present

## 2015-05-18 ENCOUNTER — Encounter: Payer: Self-pay | Admitting: Family Medicine

## 2015-05-19 DIAGNOSIS — I739 Peripheral vascular disease, unspecified: Secondary | ICD-10-CM | POA: Diagnosis not present

## 2015-05-19 DIAGNOSIS — Z7901 Long term (current) use of anticoagulants: Secondary | ICD-10-CM | POA: Diagnosis not present

## 2015-05-19 DIAGNOSIS — E119 Type 2 diabetes mellitus without complications: Secondary | ICD-10-CM | POA: Diagnosis not present

## 2015-05-19 DIAGNOSIS — E039 Hypothyroidism, unspecified: Secondary | ICD-10-CM | POA: Diagnosis not present

## 2015-05-19 DIAGNOSIS — Z471 Aftercare following joint replacement surgery: Secondary | ICD-10-CM | POA: Diagnosis not present

## 2015-05-19 DIAGNOSIS — Z794 Long term (current) use of insulin: Secondary | ICD-10-CM | POA: Diagnosis not present

## 2015-05-19 DIAGNOSIS — I2510 Atherosclerotic heart disease of native coronary artery without angina pectoris: Secondary | ICD-10-CM | POA: Diagnosis not present

## 2015-05-19 DIAGNOSIS — I10 Essential (primary) hypertension: Secondary | ICD-10-CM | POA: Diagnosis not present

## 2015-05-19 DIAGNOSIS — Z96641 Presence of right artificial hip joint: Secondary | ICD-10-CM | POA: Diagnosis not present

## 2015-06-06 ENCOUNTER — Encounter: Payer: Self-pay | Admitting: Family Medicine

## 2015-06-08 ENCOUNTER — Ambulatory Visit (INDEPENDENT_AMBULATORY_CARE_PROVIDER_SITE_OTHER): Payer: Medicare Other | Admitting: Family Medicine

## 2015-06-08 ENCOUNTER — Encounter: Payer: Self-pay | Admitting: Family Medicine

## 2015-06-08 VITALS — BP 128/68 | HR 97 | Temp 97.9°F | Resp 16 | Ht 61.0 in | Wt 116.4 lb

## 2015-06-08 DIAGNOSIS — Z96643 Presence of artificial hip joint, bilateral: Secondary | ICD-10-CM | POA: Diagnosis not present

## 2015-06-08 DIAGNOSIS — E1159 Type 2 diabetes mellitus with other circulatory complications: Secondary | ICD-10-CM

## 2015-06-08 DIAGNOSIS — M159 Polyosteoarthritis, unspecified: Secondary | ICD-10-CM | POA: Insufficient documentation

## 2015-06-08 DIAGNOSIS — Z0000 Encounter for general adult medical examination without abnormal findings: Secondary | ICD-10-CM | POA: Diagnosis not present

## 2015-06-08 DIAGNOSIS — E1151 Type 2 diabetes mellitus with diabetic peripheral angiopathy without gangrene: Secondary | ICD-10-CM

## 2015-06-08 DIAGNOSIS — E034 Atrophy of thyroid (acquired): Secondary | ICD-10-CM | POA: Diagnosis not present

## 2015-06-08 DIAGNOSIS — I10 Essential (primary) hypertension: Secondary | ICD-10-CM | POA: Diagnosis not present

## 2015-06-08 DIAGNOSIS — E038 Other specified hypothyroidism: Secondary | ICD-10-CM | POA: Diagnosis not present

## 2015-06-08 DIAGNOSIS — G8929 Other chronic pain: Secondary | ICD-10-CM | POA: Insufficient documentation

## 2015-06-08 DIAGNOSIS — E039 Hypothyroidism, unspecified: Secondary | ICD-10-CM | POA: Insufficient documentation

## 2015-06-08 DIAGNOSIS — M25512 Pain in left shoulder: Secondary | ICD-10-CM

## 2015-06-08 DIAGNOSIS — G4700 Insomnia, unspecified: Secondary | ICD-10-CM | POA: Insufficient documentation

## 2015-06-08 NOTE — Progress Notes (Signed)
Name: Laura [name]   MRN: [id number]    DOB: [birth year]   Date:06/08/2015       Progress Note  Subjective  Chief Complaint  Chief Complaint  Patient presents with  . Annual Exam    HPI  Patient is here today for a Female Medicare Wellness Visit:  The patient has been in otherwise good general health and voices no acute concerns today.  In general she is doing well since her recent right hip replacement surgery. She has completed PT and graduated from a walker to a cane to nothing now. Pain improved, needing opioid medication only as needed which is not daily.  Just after her surgery she had some low blood pressure so she has been taking 1/2 tablet of Benazepril-HCTZ 20-25mg  and has kept a blood pressure log which shows BPs systolic range 130-145 and diastolic 60-75. She notes no dizziness, chest pain, fatigue, memory changes.   Her blood sugars were out of control soon after the surgery she reports but has now come down to her usual levels of 150-200s. She is taking Lantus 25units once a day along with Humalog 6 units with breakfast, 11 units with lunch and 11 units with dinner.    No problem-specific assessment & plan notes found for this encounter.   Past Medical History  Diagnosis Date  . Hypertension   . Diabetes mellitus without complication   . Peripheral vascular disease   . Thyroid disease   . Arthritis   . Atherosclerosis of native arteries of the extremities, unspecified   . Embolism and thrombosis of iliac artery   . PONV (postoperative nausea and vomiting)     Past Surgical History  Procedure Laterality Date  . Colonoscopy  2012  . Thrombectomy  2001    RT GROIN  . Joint replacement Left 2010    Hip  . Eye surgery Bilateral 2010    cataract surgery  . Total hip arthroplasty Right 05/01/2015    Procedure: RIGHT TOTAL HIP ARTHROPLASTY ANTERIOR APPROACH;  Surgeon: Samson Frederic, MD;  Location: [facility] OR;  Service: Orthopedics;  Laterality: Right;    Family  History  Problem Relation Age of Onset  . Hypertension Mother     History   Social History  . Marital Status: Married    Spouse Name: N/A  . Number of Children: N/A  . Years of Education: N/A   Occupational History  . Not on file.   Social History Main Topics  . Smoking status: Former Smoker -- 1.00 packs/day for 20 years    Quit date: 04/21/2003  . Smokeless tobacco: Never Used  . Alcohol Use: No  . Drug Use: No  . Sexual Activity: Not Currently   Other Topics Concern  . Not on file   Social History Narrative     Current outpatient prescriptions:  .  aspirin EC 81 MG tablet, Take 81 mg by mouth daily., Disp: , Rfl:  .  benazepril-hydrochlorthiazide (LOTENSIN HCT) 20-25 MG per tablet, Take 1 tablet by mouth daily., Disp: , Rfl:  .  calcium-vitamin D (OSCAL WITH D) 500-200 MG-UNIT per tablet, Take 1 tablet by mouth 2 (two) times daily., Disp: , Rfl:  .  Cyanocobalamin (B-12) 5000 MCG CAPS, Take 1 capsule by mouth daily., Disp: , Rfl:  .  docusate sodium (COLACE) 100 MG capsule, Take 1 capsule (100 mg total) by mouth 2 (two) times daily., Disp: 60 capsule, Rfl: 3 .  glucose blood (ACCU-CHEK AVIVA) test strip, Use as instructed  4x/day, Disp: 360 each, Rfl: 2 .  insulin glargine (LANTUS) 100 UNIT/ML injection, Inject 25 Units into the skin every morning. , Disp: , Rfl:  .  insulin lispro (HUMALOG) 100 UNIT/ML injection, Inject 6-10 Units into the skin 3 (three) times daily before meals. 6 units in the morning and 10 with lunch and dinner, Disp: , Rfl:  .  levothyroxine (SYNTHROID, LEVOTHROID) 75 MCG tablet, Take 75 mcg by mouth daily before breakfast., Disp: , Rfl:  .  Nutritional Supplements (JUICE PLUS FIBRE PO), Take 2 capsules by mouth 2 (two) times daily., Disp: , Rfl:  .  Omega-3 Fatty Acids (FISH OIL) 1200 MG CPDR, Take 1 capsule by mouth once., Disp: , Rfl:  .  pravastatin (PRAVACHOL) 40 MG tablet, Take 40 mg by mouth daily., Disp: , Rfl:  .  traZODone (DESYREL) 150 MG  tablet, Take 150 mg by mouth at bedtime as needed for sleep., Disp: , Rfl:   Allergies  Allergen Reactions  . Morphine And Related Nausea And Vomiting    Fall Risk: Fall Risk  06/08/2015  Falls in the past year? No    Depression screen PHQ 2/9 06/08/2015  Decreased Interest 0  Down, Depressed, Hopeless 0  PHQ - 2 Score 0   Functional Status Survey: Is the patient deaf or have difficulty hearing?: No Does the patient have difficulty seeing, even when wearing glasses/contacts?: Yes (glasses) Does the patient have difficulty concentrating, remembering, or making decisions?: No Does the patient have difficulty walking or climbing stairs?: No Does the patient have difficulty dressing or bathing?: No Does the patient have difficulty doing errands alone such as visiting a doctor's office or shopping?: No    ROS  CONSTITUTIONAL: No significant weight changes, fever, chills, weakness or fatigue.  HEENT:  - Eyes: No visual changes.  - Ears: No auditory changes. No pain.  - Nose: No sneezing, congestion, runny nose. - Throat: No sore throat. No changes in swallowing. SKIN: No rash or itching.  CARDIOVASCULAR: No chest pain, chest pressure or chest discomfort. No palpitations or edema.  RESPIRATORY: No shortness of breath, cough or sputum.  GASTROINTESTINAL: No anorexia, nausea, vomiting. No changes in bowel habits. No abdominal pain or blood.  GENITOURINARY: No dysuria. No frequency. No discharge.  NEUROLOGICAL: No headache, dizziness, syncope, paralysis, ataxia, numbness or tingling in the extremities. No memory changes. No change in bowel or bladder control.  MUSCULOSKELETAL: No joint pain. No muscle pain. HEMATOLOGIC: No anemia, bleeding or bruising.  LYMPHATICS: No enlarged lymph nodes.  PSYCHIATRIC: No change in mood. No change in sleep pattern.  ENDOCRINOLOGIC: No reports of sweating, cold or heat intolerance. No polyuria or polydipsia.   Objective  Filed Vitals:   06/08/15  0935  BP: 128/68  Pulse: 97  Temp: 97.9 F (36.6 C)  TempSrc: Oral  Resp: 16  Height: 5\' 1"  (1.549 m)  Weight: 116 lb 6.4 oz (52.799 kg)  SpO2: 94%   Body mass index is 22.01 kg/(m^2).  Physical Exam  Constitutional: Patient appears well-developed and well-nourished. In no distress.  HEENT:  - Head: Normocephalic and atraumatic.  - Ears: Bilateral TMs gray, no erythema or effusion - Nose: Nasal mucosa moist - Mouth/Throat: Oropharynx is clear and moist. No tonsillar hypertrophy or erythema. No post nasal drainage.  - Eyes: Conjunctivae clear, EOM movements normal. PERRLA. No scleral icterus.  Neck: Normal range of motion. Neck supple. No JVD present. No thyromegaly present.  Cardiovascular: Normal rate, regular rhythm and normal heart sounds.  No murmur heard.  Pulmonary/Chest: Effort normal and breath sounds normal. No respiratory distress. Breast: Bilateral breast with no mass, skin dimpling, nipple discharge Musculoskeletal: Normal range of motion bilateral UE and LE, no joint effusions. DIP/PIP joints of bilateral hands with nodules. Right hip surgical scar well healed.  Peripheral vascular: Bilateral LE no edema. Neurological: CN II-XII grossly intact with no focal deficits. Alert and oriented to person, place, and time. Coordination, balance, strength, speech and gait are normal.  Skin: Skin is warm and dry. No rash noted. No erythema.  Psychiatric: Patient has a normal mood and affect. Behavior is normal in office today. Judgment and thought content normal in office today.   Assessment & Plan 1. Medicare annual wellness visit, subsequent Functional ability/safety issues: No Issues Hearing issues: Addressed  Activities of daily living: Discussed Home safety issues: No Issues  End Of Life Planning: Offered verbal information regarding advanced directives, healthcare power of attorney.  Preventative care, Health maintenance, Preventative health measures  discussed.  Preventative screenings discussed today: lab work, colonoscopy, PAP, mammogram, DEXA.  Low Dose CT Chest recommended if Age 6-80 years, 30 pack-year currently smoking OR have quit w/in 15years.   Lifestyle risk factor issued reviewed: Diet, exercise, weight management, advised patient smoking is not healthy, nutrition/diet.  Preventative health measures discussed (5-10 year plan).  Reviewed and recommended vaccinations: - Pneumovax due after 09/14/2015 - Prevnar done 09/13/2014 - Annual Influenza  - Zostavax - Tdap   Depression screening: Done Fall risk screening: Done Discuss ADLs/IADLs: Done  Current medical providers: See HPI  Other health risk factors identified this visit: No other issues Cognitive impairment issues: None identified  All above discussed with patient. Appropriate education, counseling and referral will be made based upon the above.    2. Type 2 diabetes, controlled, with peripheral circulatory disorder We discussed possibly switching her to [place] near future and discontinuing Lantus and Humalog as she is frequently hitting the donut hole of her insurance coverage at the mid-end of the year.   3. Hypothyroidism due to acquired atrophy of thyroid Recheck lab work at follow up visit. Currently on levothyroxine .  4. Hypertension goal BP (blood pressure) < 150/90 Well controled on 1/2 dose of benazepril-hctz 20-25mg . Plan to discontinue near future and just continue benazepril 10mg .   5. History of bilateral hip replacements Doing well post op.

## 2015-06-14 DIAGNOSIS — Z96641 Presence of right artificial hip joint: Secondary | ICD-10-CM | POA: Diagnosis not present

## 2015-06-14 DIAGNOSIS — Z471 Aftercare following joint replacement surgery: Secondary | ICD-10-CM | POA: Diagnosis not present

## 2015-06-28 ENCOUNTER — Other Ambulatory Visit: Payer: Self-pay | Admitting: Family Medicine

## 2015-06-28 DIAGNOSIS — R0982 Postnasal drip: Principal | ICD-10-CM

## 2015-06-28 DIAGNOSIS — J309 Allergic rhinitis, unspecified: Secondary | ICD-10-CM

## 2015-06-28 MED ORDER — FLUTICASONE PROPIONATE 50 MCG/ACT NA SUSP: 2.0000 | Freq: Every day | NASAL | Status: AC

## 2015-06-28 NOTE — Telephone Encounter (Signed)
Refill request was sent to Dr. Ashany Sundaram for approval and submission.  

## 2015-06-28 NOTE — Telephone Encounter (Signed)
Pt would like to know if you could call in Flonaise nose spray. [facility] [address].

## 2015-07-05 DIAGNOSIS — H35033 Hypertensive retinopathy, bilateral: Secondary | ICD-10-CM | POA: Diagnosis not present

## 2015-07-05 DIAGNOSIS — E119 Type 2 diabetes mellitus without complications: Secondary | ICD-10-CM | POA: Diagnosis not present

## 2015-07-05 DIAGNOSIS — H524 Presbyopia: Secondary | ICD-10-CM | POA: Diagnosis not present

## 2015-07-11 ENCOUNTER — Ambulatory Visit (INDEPENDENT_AMBULATORY_CARE_PROVIDER_SITE_OTHER): Payer: Medicare Other | Admitting: Family Medicine

## 2015-07-11 ENCOUNTER — Encounter: Payer: Self-pay | Admitting: Family Medicine

## 2015-07-11 VITALS — BP 126/74 | HR 83 | Temp 97.5°F | Resp 16 | Wt 117.1 lb

## 2015-07-11 DIAGNOSIS — Z23 Encounter for immunization: Secondary | ICD-10-CM | POA: Diagnosis not present

## 2015-07-11 DIAGNOSIS — I10 Essential (primary) hypertension: Secondary | ICD-10-CM | POA: Diagnosis not present

## 2015-07-11 DIAGNOSIS — E1159 Type 2 diabetes mellitus with other circulatory complications: Secondary | ICD-10-CM

## 2015-07-11 DIAGNOSIS — E034 Atrophy of thyroid (acquired): Secondary | ICD-10-CM

## 2015-07-11 DIAGNOSIS — Z113 Encounter for screening for infections with a predominantly sexual mode of transmission: Secondary | ICD-10-CM | POA: Diagnosis not present

## 2015-07-11 DIAGNOSIS — E038 Other specified hypothyroidism: Secondary | ICD-10-CM | POA: Diagnosis not present

## 2015-07-11 DIAGNOSIS — E1151 Type 2 diabetes mellitus with diabetic peripheral angiopathy without gangrene: Secondary | ICD-10-CM

## 2015-07-11 LAB — POCT UA - MICROALBUMIN: Microalbumin Ur, POC: 20 mg/L

## 2015-07-11 MED ORDER — INSULIN PEN NEEDLE 31G X 5 MM MISC: 1.0000 | Freq: Three times a day (TID) | Status: AC

## 2015-07-11 MED ORDER — INSULIN PEN NEEDLE 32G X 4 MM MISC: 1.0000 | Freq: Every day | Status: AC

## 2015-07-11 MED ORDER — LISINOPRIL-HYDROCHLOROTHIAZIDE 10-12.5 MG PO TABS: 1.0000 | ORAL_TABLET | Freq: Every day | ORAL | Status: AC

## 2015-07-11 NOTE — Patient Instructions (Signed)
STOP HUMALOG FOR NOW  START Novolog PEN 4 units with breakfast, lunch, dinner  STOP LANTUS FOR NOW  START Toujeo PEN 25 units every morning   KEEP A JOURNAL OF ALL YOUR SUGAR RESULTS AND BRING YOUR JOURNAL AND GLUCOMETER TO YOUR NEXT VISIT.

## 2015-07-11 NOTE — Progress Notes (Signed)
Name: Laura [name]   MRN: [id number]    DOB: June 25, 1941   Date:07/11/2015       Progress Note  Subjective  Chief Complaint  Chief Complaint  Patient presents with  . Labs Only    patient wants everything checked  . Medication Management    patient stated that she was told to come in today to make adjustments to her medicine (insulin). patient has not had any issues.    HPI  Mrs Laura [name] is a 74 year old female with a know history of hypothyroidism, osteoarthritis, DM II with fluctuating control, atherosclerotic PAD, HTN, recent hip replacement (both replaced now).  Today she reports being in the donut hole of her insurance coverage and having to stretch out her medications by not using as much or as directed to make her medication last. In general she is doing well since her recent right hip replacement surgery some months ago. She has completed PT and graduated from a walker to a cane to nothing now. Pain improved, needing opioid medication only as needed which is not daily.  Just after her surgery she had some low blood pressure so she has been taking 1/2 tablet of Benazepril-HCTZ 20-42m and has kept a blood pressure log which shows BPs systolic range 1425-956and diastolic 638-75 She notes no dizziness, chest pain, fatigue, memory changes.  Her blood sugars were out of control soon after the surgery she reports but has now come down to her usual levels of 150-200s. She is taking Lantus 25units once a day along with Humalog 6 units with breakfast, 11 units with lunch and 11 units with dinner. At our last visit we discussed switching to [place] Patient presents for evaluation of thyroid function. Symptoms consist of denies fatigue, weight changes, heat/cold intolerance, bowel/skin changes or CVS symptoms. Symptoms have present for several years. The symptoms are mild and off and on.  The problem has been stable.  Previous thyroid studies include TSH, triiodothyronine free and free thyroxine.  The hypothyroidism is due to hypothyroidism and Hashimoto's disease.    Patient Active Problem List   Diagnosis Date Noted  . Need for immunization against influenza 07/11/2015  . Osteoarthrosis, generalized, involving multiple sites 06/08/2015  . Adult hypothyroidism 06/08/2015  . Insomnia, controlled 06/08/2015  . Hypertension goal BP (blood pressure) < 140/90 06/08/2015  . Chronic left shoulder pain 06/08/2015  . History of bilateral hip replacements 05/01/2015  . Type 2 diabetes, controlled, with peripheral circulatory disorder 04/28/2015  . Atherosclerotic peripheral vascular disease with intermittent claudication 08/18/2013    Social History  Substance Use Topics  . Smoking status: Former Smoker -- 1.00 packs/day for 20 years    Quit date: 04/21/2003  . Smokeless tobacco: Never Used  . Alcohol Use: No     Current outpatient prescriptions:  .  lisinopril-hydrochlorothiazide (PRINZIDE,ZESTORETIC) 20-25 MG per tablet, Take 1 tablet by mouth daily., Disp: , Rfl:  .  lovastatin (ALTOPREV) 20 MG 24 hr tablet, Take 20 mg by mouth at bedtime., Disp: , Rfl:  .  aspirin EC 81 MG tablet, Take 81 mg by mouth daily., Disp: , Rfl:  .  calcium-vitamin D (OSCAL WITH D) 500-200 MG-UNIT per tablet, Take 1 tablet by mouth 2 (two) times daily., Disp: , Rfl:  .  Cyanocobalamin (B-12) 5000 MCG CAPS, Take 1 capsule by mouth daily., Disp: , Rfl:  .  fluticasone (FLONASE) 50 MCG/ACT nasal spray, Place 2 sprays into both nostrils daily., Disp: 16 g, Rfl: 5 .  glucose blood (ACCU-CHEK AVIVA) test strip, Use as instructed 4x/day, Disp: 360 each, Rfl: 2 .  insulin glargine (LANTUS) 100 UNIT/ML injection, Inject 25 Units into the skin every morning. , Disp: , Rfl:  .  insulin lispro (HUMALOG) 100 UNIT/ML injection, Inject 6-12 Units into the skin 3 (three) times daily before meals. 6units breakfast, 11units lunch, 11units dinner, Disp: , Rfl:  .  levothyroxine (SYNTHROID, LEVOTHROID) 75 MCG tablet, Take  75 mcg by mouth daily before breakfast., Disp: , Rfl:  .  Nutritional Supplements (JUICE PLUS FIBRE PO), Take 2 capsules by mouth 2 (two) times daily., Disp: , Rfl:  .  Omega-3 Fatty Acids (FISH OIL) 1200 MG CPDR, Take 1 capsule by mouth once., Disp: , Rfl:  .  traZODone (DESYREL) 150 MG tablet, Take 150 mg by mouth at bedtime as needed for sleep., Disp: , Rfl:   Past Surgical History  Procedure Laterality Date  . Colonoscopy  2012  . Thrombectomy  2001    RT GROIN  . Joint replacement Left 2010    Hip  . Eye surgery Bilateral 2010    cataract surgery  . Total hip arthroplasty Right 05/01/2015    Procedure: RIGHT TOTAL HIP ARTHROPLASTY ANTERIOR APPROACH;  Surgeon: Rod Can, MD;  Location: [place];  Service: Orthopedics;  Laterality: Right;    Family History  Problem Relation Age of Onset  . Hypertension Mother     Allergies  Allergen Reactions  . Morphine And Related Nausea And Vomiting     Review of Systems  CONSTITUTIONAL: No significant weight changes, fever, chills, weakness or fatigue.  HEENT:  - Eyes: No visual changes.  - Ears: No auditory changes. No pain.  - Nose: No sneezing, congestion, runny nose. - Throat: No sore throat. No changes in swallowing. SKIN: No rash or itching.  CARDIOVASCULAR: No chest pain, chest pressure or chest discomfort. No palpitations or edema.  RESPIRATORY: No shortness of breath, cough or sputum.  GASTROINTESTINAL: No anorexia, nausea, vomiting. No changes in bowel habits. No abdominal pain or blood.  GENITOURINARY: No dysuria. No frequency. No discharge. NEUROLOGICAL: No headache, dizziness, syncope, paralysis, ataxia, numbness or tingling in the extremities. No memory changes. No change in bowel or bladder control.  MUSCULOSKELETAL: No joint pain. No muscle pain. HEMATOLOGIC: No anemia, bleeding or bruising.  LYMPHATICS: No enlarged lymph nodes.  PSYCHIATRIC: No change in mood. No change in sleep pattern.  ENDOCRINOLOGIC: No  reports of sweating, cold or heat intolerance. No polyuria or polydipsia.     Objective  BP 126/74 mmHg  Pulse 83  Temp(Src) 97.5 F (36.4 C) (Oral)  Resp 16  Wt 117 lb 1.6 oz (53.116 kg)  SpO2 95% Body mass index is 22.14 kg/(m^2).  Physical Exam  Constitutional: Patient appears well-developed and well-nourished. In no distress.  HEENT:  - Head: Normocephalic and atraumatic.  - Ears: Bilateral TMs gray, no erythema or effusion - Nose: Nasal mucosa moist - Mouth/Throat: Oropharynx is clear and moist. No tonsillar hypertrophy or erythema. No post nasal drainage.  - Eyes: Conjunctivae clear, EOM movements normal. PERRLA. No scleral icterus.  Neck: Normal range of motion. Neck supple. No JVD present. No thyromegaly present.  Cardiovascular: Normal rate, regular rhythm and normal heart sounds.  No murmur heard.  Pulmonary/Chest: Effort normal and breath sounds normal. No respiratory distress. Musculoskeletal: Normal range of motion bilateral UE and LE, no joint effusions. Peripheral vascular: Bilateral LE no edema. Neurological: CN II-XII grossly intact with no focal deficits. Alert and  oriented to person, place, and time. Coordination, balance, strength, speech and gait are normal.  Skin: Skin is warm and dry. No rash noted. No erythema.  Psychiatric: Patient has a normal mood and affect. Behavior is normal in office today. Judgment and thought content normal in office today.  Diabetic Foot Exam - Simple   Simple Foot Form  Diabetic Foot exam was performed with the following findings:  Yes 07/11/2015  9:42 AM  Visual Inspection  No deformities, no ulcerations, no other skin breakdown bilaterally:  Yes  Sensation Testing  Intact to touch and monofilament testing bilaterally:  Yes  Pulse Check  See comments:  Yes  Comments  Diminished 1+/2 posterior tibialis and dorsalis pulse bilaterally.       Recent Results (from the past 2160 hour(s))  Urinalysis, Routine w reflex  microscopic (not at [facility])     Status: None   Collection Time: 04/21/15 10:22 AM  Result Value Ref Range   Color, Urine YELLOW YELLOW   APPearance CLEAR CLEAR   Specific Gravity, Urine 1.010 1.005 - 1.030   pH 6.0 5.0 - 8.0   Glucose, UA NEGATIVE NEGATIVE mg/dL   Hgb urine dipstick NEGATIVE NEGATIVE   Bilirubin Urine NEGATIVE NEGATIVE   Ketones, ur NEGATIVE NEGATIVE mg/dL   Protein, ur NEGATIVE NEGATIVE mg/dL   Urobilinogen, UA 0.2 0.0 - 1.0 mg/dL   Nitrite NEGATIVE NEGATIVE   Leukocytes, UA NEGATIVE NEGATIVE    Comment: MICROSCOPIC NOT DONE ON URINES WITH NEGATIVE PROTEIN, BLOOD, LEUKOCYTES, NITRITE, OR GLUCOSE <1000 mg/dL.  Surgical pcr screen     Status: Abnormal   Collection Time: 04/21/15 10:50 AM  Result Value Ref Range   MRSA, PCR NEGATIVE NEGATIVE   Staphylococcus aureus POSITIVE (A) NEGATIVE    Comment:        The Xpert SA Assay (FDA approved for NASAL specimens in patients over 31 years of age), is one component of a comprehensive surveillance program.  Test performance has been validated by [facility] for patients greater than or equal to 9 year old. It is not intended to diagnose infection nor to guide or monitor treatment.   APTT     Status: None   Collection Time: 04/21/15 10:50 AM  Result Value Ref Range   aPTT 27 24 - 37 seconds  CBC     Status: None   Collection Time: 04/21/15 10:50 AM  Result Value Ref Range   WBC 4.9 4.0 - 10.5 K/uL   RBC 4.30 3.87 - 5.11 MIL/uL   Hemoglobin 12.6 12.0 - 15.0 g/dL   HCT 38.2 36.0 - 46.0 %   MCV 88.8 78.0 - 100.0 fL   MCH 29.3 26.0 - 34.0 pg   MCHC 33.0 30.0 - 36.0 g/dL   RDW 13.1 11.5 - 15.5 %   Platelets 227 150 - 400 K/uL  Comprehensive metabolic panel     Status: Abnormal   Collection Time: 04/21/15 10:50 AM  Result Value Ref Range   Sodium 140 135 - 145 mmol/L   Potassium 4.9 3.5 - 5.1 mmol/L   Chloride 102 101 - 111 mmol/L   CO2 30 22 - 32 mmol/L   Glucose, Bld 82 65 - 99 mg/dL   BUN 24 (H) 6 - 20  mg/dL   Creatinine, Ser 0.70 0.44 - 1.00 mg/dL   Calcium 10.1 8.9 - 10.3 mg/dL   Total Protein 6.1 (L) 6.5 - 8.1 g/dL   Albumin 3.8 3.5 - 5.0 g/dL   AST 15 15 -  41 U/L   ALT 15 14 - 54 U/L   Alkaline Phosphatase 57 38 - 126 U/L   Total Bilirubin 0.3 0.3 - 1.2 mg/dL   GFR calc non Af Amer >60 >60 mL/min   GFR calc Af Amer >60 >60 mL/min    Comment: (NOTE) The eGFR has been calculated using the CKD EPI equation. This calculation has not been validated in all clinical situations. eGFR's persistently <60 mL/min signify possible Chronic Kidney Disease.    Anion gap 8 5 - 15  Protime-INR     Status: None   Collection Time: 04/21/15 10:50 AM  Result Value Ref Range   Prothrombin Time 13.7 11.6 - 15.2 seconds   INR 1.03 0.00 - 1.49  Type and screen     Status: None   Collection Time: 04/21/15 10:50 AM  Result Value Ref Range   ABO/RH(D) AB POS    Antibody Screen NEG    Sample Expiration 04/30/2015   ABO/Rh     Status: None   Collection Time: 04/21/15 11:00 AM  Result Value Ref Range   ABO/RH(D) AB POS   Type and screen     Status: None   Collection Time: 05/01/15  9:38 AM  Result Value Ref Range   ABO/RH(D) AB POS    Antibody Screen NEG    Sample Expiration 05/04/2015   ABO/Rh     Status: None   Collection Time: 05/01/15  9:38 AM  Result Value Ref Range   ABO/RH(D) AB POS   Glucose, capillary     Status: Abnormal   Collection Time: 05/01/15  9:40 AM  Result Value Ref Range   Glucose-Capillary 308 (H) 65 - 99 mg/dL  Glucose, capillary     Status: Abnormal   Collection Time: 05/01/15 11:26 AM  Result Value Ref Range   Glucose-Capillary 247 (H) 65 - 99 mg/dL  Glucose, capillary     Status: Abnormal   Collection Time: 05/01/15  4:07 PM  Result Value Ref Range   Glucose-Capillary 125 (H) 65 - 99 mg/dL  Glucose, capillary     Status: Abnormal   Collection Time: 05/01/15 11:29 PM  Result Value Ref Range   Glucose-Capillary 259 (H) 65 - 99 mg/dL  Glucose, capillary      Status: Abnormal   Collection Time: 05/02/15  6:32 AM  Result Value Ref Range   Glucose-Capillary 215 (H) 65 - 99 mg/dL  CBC     Status: Abnormal   Collection Time: 05/02/15  7:20 AM  Result Value Ref Range   WBC 4.5 4.0 - 10.5 K/uL   RBC 3.20 (L) 3.87 - 5.11 MIL/uL   Hemoglobin 9.4 (L) 12.0 - 15.0 g/dL   HCT 27.9 (L) 36.0 - 46.0 %   MCV 87.2 78.0 - 100.0 fL   MCH 29.4 26.0 - 34.0 pg   MCHC 33.7 30.0 - 36.0 g/dL   RDW 13.1 11.5 - 15.5 %   Platelets 157 150 - 400 K/uL  Basic metabolic panel     Status: Abnormal   Collection Time: 05/02/15  7:20 AM  Result Value Ref Range   Sodium 133 (L) 135 - 145 mmol/L   Potassium 4.5 3.5 - 5.1 mmol/L   Chloride 106 101 - 111 mmol/L   CO2 25 22 - 32 mmol/L   Glucose, Bld 265 (H) 65 - 99 mg/dL   BUN 14 6 - 20 mg/dL   Creatinine, Ser 0.78 0.44 - 1.00 mg/dL   Calcium 7.7 (L) 8.9 -  10.3 mg/dL   GFR calc non Af Amer >60 >60 mL/min   GFR calc Af Amer >60 >60 mL/min    Comment: (NOTE) The eGFR has been calculated using the CKD EPI equation. This calculation has not been validated in all clinical situations. eGFR's persistently <60 mL/min signify possible Chronic Kidney Disease.    Anion gap 2 (L) 5 - 15    Comment: REPEATED TO VERIFY  Glucose, capillary     Status: Abnormal   Collection Time: 05/02/15 12:53 PM  Result Value Ref Range   Glucose-Capillary 226 (H) 65 - 99 mg/dL  Glucose, capillary     Status: Abnormal   Collection Time: 05/02/15  2:39 PM  Result Value Ref Range   Glucose-Capillary 249 (H) 65 - 99 mg/dL   Comment 1 Repeat Test    Comment 2 Document in Chart      Assessment & Plan  1. Type 2 diabetes, controlled, with peripheral circulatory disorder STOP HUMALOG FOR NOW  START Novolog PEN 4 units with breakfast, lunch, dinner  STOP LANTUS FOR NOW  START Toujeo PEN 25 units every morning   KEEP A JOURNAL OF ALL YOUR SUGAR RESULTS AND BRING YOUR JOURNAL AND GLUCOMETER TO YOUR NEXT VISIT.  - Insulin Pen Needle  (NOVOFINE PLUS) 32G X 4 MM MISC; 1 Device by Does not apply route daily.  Dispense: 30 each; Refill: 1 - Insulin Pen Needle 31G X 5 MM MISC; 1 Device by Does not apply route 3 (three) times daily.  Dispense: 100 each; Refill: 1 - CBC with Differential/Platelet - Comprehensive metabolic panel - Lipid panel - Hemoglobin A1c - POCT UA - Microalbumin  2. Need for immunization against influenza   3. Hypertension goal BP (blood pressure) < 140/90 Changed Prinzide dose from 20-18m to 10-12.558m  - lisinopril-hydrochlorothiazide (PRINZIDE,ZESTORETIC) 10-12.5 MG per tablet; Take 1 tablet by mouth daily.  Dispense: 90 tablet; Refill: 3 - CBC with Differential/Platelet - Comprehensive metabolic panel - Lipid panel  4. Hypothyroidism due to acquired atrophy of thyroid Lab work.  - TSH - T3, free - T4, free  5. Screening for STD (sexually transmitted disease) Lab work.  - Hepatitis C antibody - HIV antibody

## 2015-07-12 ENCOUNTER — Other Ambulatory Visit: Payer: Self-pay | Admitting: Family Medicine

## 2015-07-12 DIAGNOSIS — E034 Atrophy of thyroid (acquired): Secondary | ICD-10-CM

## 2015-07-12 LAB — COMPREHENSIVE METABOLIC PANEL
A/G RATIO: 2 (ref 1.1–2.5)
ALT: 12 IU/L (ref 0–32)
AST: 13 IU/L (ref 0–40)
Albumin: 4.2 g/dL (ref 3.5–4.8)
Alkaline Phosphatase: 62 IU/L (ref 39–117)
BILIRUBIN TOTAL: 0.4 mg/dL (ref 0.0–1.2)
BUN/Creatinine Ratio: 22 (ref 11–26)
BUN: 16 mg/dL (ref 8–27)
CALCIUM: 10.5 mg/dL — AB (ref 8.7–10.3)
CO2: 27 mmol/L (ref 18–29)
Chloride: 99 mmol/L (ref 97–108)
Creatinine, Ser: 0.72 mg/dL (ref 0.57–1.00)
GFR, EST AFRICAN AMERICAN: 95 mL/min/{1.73_m2} (ref >59)
GFR, EST NON AFRICAN AMERICAN: 83 mL/min/{1.73_m2} (ref >59)
GLOBULIN, TOTAL: 2.1 g/dL (ref 1.5–4.5)
Glucose: 87 mg/dL (ref 65–99)
POTASSIUM: 5.1 mmol/L (ref 3.5–5.2)
SODIUM: 141 mmol/L (ref 134–144)
TOTAL PROTEIN: 6.3 g/dL (ref 6.0–8.5)

## 2015-07-12 LAB — LIPID PANEL
CHOL/HDL RATIO: 2.6 ratio (ref 0.0–4.4)
Cholesterol, Total: 157 mg/dL (ref 100–199)
HDL: 61 mg/dL (ref >39)
LDL Calculated: 85 mg/dL (ref 0–99)
Triglycerides: 53 mg/dL (ref 0–149)
VLDL Cholesterol Cal: 11 mg/dL (ref 5–40)

## 2015-07-12 LAB — CBC WITH DIFFERENTIAL/PLATELET
BASOS: 0 %
Basophils Absolute: 0 10*3/uL (ref 0.0–0.2)
EOS (ABSOLUTE): 0.2 10*3/uL (ref 0.0–0.4)
EOS: 3 %
HEMATOCRIT: 39.3 % (ref 34.0–46.6)
Hemoglobin: 12.8 g/dL (ref 11.1–15.9)
IMMATURE GRANS (ABS): 0 10*3/uL (ref 0.0–0.1)
IMMATURE GRANULOCYTES: 0 %
Lymphocytes Absolute: 1.1 10*3/uL (ref 0.7–3.1)
Lymphs: 19 %
MCH: 28.3 pg (ref 26.6–33.0)
MCHC: 32.6 g/dL (ref 31.5–35.7)
MCV: 87 fL (ref 79–97)
MONOS ABS: 0.3 10*3/uL (ref 0.1–0.9)
Monocytes: 5 %
NEUTROS ABS: 4.3 10*3/uL (ref 1.4–7.0)
NEUTROS PCT: 73 %
Platelets: 275 10*3/uL (ref 150–379)
RBC: 4.53 x10E6/uL (ref 3.77–5.28)
RDW: 13.8 % (ref 12.3–15.4)
WBC: 5.9 10*3/uL (ref 3.4–10.8)

## 2015-07-12 LAB — HEMOGLOBIN A1C
ESTIMATED AVERAGE GLUCOSE: 160 mg/dL
HEMOGLOBIN A1C: 7.2 % — AB (ref 4.8–5.6)

## 2015-07-12 LAB — T4, FREE: Free T4: 2.04 ng/dL — ABNORMAL HIGH (ref 0.82–1.77)

## 2015-07-12 LAB — HIV ANTIBODY (ROUTINE TESTING W REFLEX): HIV SCREEN 4TH GENERATION: NONREACTIVE

## 2015-07-12 LAB — TSH: TSH: 0.075 u[IU]/mL — AB (ref 0.450–4.500)

## 2015-07-12 LAB — HEPATITIS C ANTIBODY: Hep C Virus Ab: <0.1 s/co ratio (ref 0.0–0.9)

## 2015-07-12 LAB — T3, FREE: T3 FREE: 3.3 pg/mL (ref 2.0–4.4)

## 2015-07-12 MED ORDER — LEVOTHYROXINE SODIUM 50 MCG PO TABS: 50.0000 ug | ORAL_TABLET | Freq: Every day | ORAL | Status: DC

## 2015-07-25 ENCOUNTER — Other Ambulatory Visit: Payer: Self-pay | Admitting: Family Medicine

## 2015-07-25 ENCOUNTER — Telehealth: Payer: Self-pay | Admitting: Family Medicine

## 2015-07-25 MED ORDER — INSULIN GLARGINE 300 UNIT/ML ~~LOC~~ SOPN: 25.0000 [IU] | PEN_INJECTOR | Freq: Every day | SUBCUTANEOUS | Status: DC

## 2015-07-25 NOTE — Telephone Encounter (Signed)
Pt states she needs Toujeo sample or a RX called into [facility] [address]. Pt has an appt on 08/08/15 but does not think she has enough to last until her appt.

## 2015-07-25 NOTE — Telephone Encounter (Signed)
Patient was informed.

## 2015-07-25 NOTE — Telephone Encounter (Signed)
Sent to [facility] [address].

## 2015-07-27 ENCOUNTER — Other Ambulatory Visit: Payer: Self-pay | Admitting: Family Medicine

## 2015-07-31 ENCOUNTER — Telehealth: Payer: Self-pay | Admitting: Family Medicine

## 2015-07-31 NOTE — Telephone Encounter (Signed)
Requesting return call pertaining to her sugar levels. Currently taking Once Day Insulin at 7am, then Humalog 4 Units during lunch and dinner. Her sugar is never regular. At bedtime it is 513. Last night she ate one hotdog and it was 393. Wondering if she not taking enough. Please return call to discuss whats going on.

## 2015-08-01 NOTE — Telephone Encounter (Signed)
Instruct patient to increase her insulins:  INCREASE Novolog PEN from 4 units to 10 units with breakfast, lunch, dinner  INCREASE Toujeo PEN from 25 units to 35 units every morning  CALL BACK IN 1 WEEK WITH NEW GLUCOSE NUMBERS

## 2015-08-01 NOTE — Telephone Encounter (Signed)
Patient called back checking status on her message, i read her what dr Sherley Bounds stated and she understood. She has appointment for next Tuesday and states she will see you then.

## 2015-08-08 ENCOUNTER — Ambulatory Visit (INDEPENDENT_AMBULATORY_CARE_PROVIDER_SITE_OTHER): Payer: Medicare Other | Admitting: Family Medicine

## 2015-08-08 ENCOUNTER — Encounter: Payer: Self-pay | Admitting: Family Medicine

## 2015-08-08 VITALS — BP 132/78 | HR 85 | Temp 97.8°F | Resp 16 | Wt 114.3 lb

## 2015-08-08 DIAGNOSIS — I10 Essential (primary) hypertension: Secondary | ICD-10-CM | POA: Diagnosis not present

## 2015-08-08 DIAGNOSIS — E1151 Type 2 diabetes mellitus with diabetic peripheral angiopathy without gangrene: Secondary | ICD-10-CM

## 2015-08-08 NOTE — Progress Notes (Signed)
Name: Laura [name]   MRN: [id number]    DOB: 06/14/1941   Date:08/08/2015       Progress Note  Subjective  Chief Complaint  Chief Complaint  Patient presents with  . Diabetes    patient is here for her 4-week follow-up    HPI  Mrs Loren Vicens is a 74 year old female with a know history of hypothyroidism, osteoarthritis, DM II with fluctuating control, atherosclerotic PAD, HTN, recent hip replacement (both replaced now). Today she is following up after I changed her insulin regimen around to the following 4 weeks ago:  STOP HUMALOG FOR NOW  START Novolog PEN 4 units with breakfast, lunch, dinner (She seems to be using both her humalog and novolog)  STOP LANTUS FOR NOW  START Toujeo PEN 25 units every morning (increased to 35 units on 08/02/15)   She has brought a journal of all of her blood glucose testing results:  Fasting: range 93-61 Lunch: range 150-200 Dinner: range 180-250 Bedtime: 86-140  Eats about 3 peanut butter crackers before bed. Denies hypoglycemic symptoms despite having some fasting glucose in the 60s.   Previously she reported being in the donut hole of her insurance coverage and having to stretch out her medications by not using as much or as directed to make her medication last so we decided to optimize her insulin regimen and make it more cost effective.   In general she is doing well since her recent right hip replacement surgery some months ago. She has completed PT and graduated from a walker to a cane to nothing now. Pain improved, needing opioid medication only as needed which is not daily. Just after her surgery she had some low blood pressure so she has been taking 1/2 tablet of Benazepril-HCTZ 20-25mg  and has kept a blood pressure log which shows BPs systolic range 130-145 and diastolic 60-75. She notes no dizziness, chest pain, fatigue, memory changes.    Past Medical History  Diagnosis Date  . Hypertension   . Diabetes mellitus without  complication (HCC)   . Peripheral vascular disease (HCC)   . Thyroid disease   . Arthritis   . Atherosclerosis of native arteries of the extremities, unspecified   . 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 PONV (postoperative nausea and vomiting)     Patient Active Problem List   Diagnosis Date Noted  . Need for immunization against influenza 07/11/2015  . Screening for STD (sexually transmitted disease) 07/11/2015  . Osteoarthrosis, generalized, involving multiple sites 06/08/2015  . Adult hypothyroidism 06/08/2015  . Insomnia, controlled 06/08/2015  . Hypertension goal BP (blood pressure) < 140/90 06/08/2015  . Chronic left shoulder pain 06/08/2015  . History of bilateral hip replacements 05/01/2015  . Type 2 diabetes, controlled, with peripheral circulatory disorder (HCC) 04/28/2015  . Atherosclerotic peripheral vascular disease with intermittent claudication (HCC) 08/18/2013    Social History  Substance Use Topics  . Smoking status: Former Smoker -- 1.00 packs/day for 20 years    Quit date: 04/21/2003  . Smokeless tobacco: Never Used  . Alcohol Use: No     Current outpatient prescriptions:  .  aspirin EC 81 MG tablet, Take 81 mg by mouth daily., Disp: , Rfl:  .  calcium-vitamin D (OSCAL WITH D) 500-200 MG-UNIT per tablet, Take 1 tablet by mouth 2 (two) times daily., Disp: , Rfl:  .  Cyanocobalamin (B-12) 5000 MCG CAPS, Take 1 capsule by mouth daily., Disp: , Rfl:  .  fluticasone (  FLONASE) 50 MCG/ACT nasal spray, Place 2 sprays into both nostrils daily., Disp: 16 g, Rfl: 5 .  glucose blood (ACCU-CHEK AVIVA) test strip, Use as instructed 4x/day, Disp: 360 each, Rfl: 2 .  Insulin Glargine 300 UNIT/ML SOPN, Inject 25 Units into the skin daily., Disp: 1.5 mL, Rfl: 1 .  insulin lispro (HUMALOG) 100 UNIT/ML injection, Inject 4 Units into the skin 3 (three) times daily before meals., Disp: , Rfl:  .  Insulin Pen Needle (NOVOFINE PLUS) 32G X 4 MM MISC, 1 Device by Does  not apply route daily., Disp: 30 each, Rfl: 1 .  Insulin Pen Needle 31G X 5 MM MISC, 1 Device by Does not apply route 3 (three) times daily., Disp: 100 each, Rfl: 1 .  levothyroxine (SYNTHROID, LEVOTHROID) 50 MCG tablet, Take 1 tablet (50 mcg total) by mouth daily., Disp: 90 tablet, Rfl: 1 .  lisinopril-hydrochlorothiazide (PRINZIDE,ZESTORETIC) 10-12.5 MG per tablet, Take 1 tablet by mouth daily., Disp: 90 tablet, Rfl: 3 .  lovastatin (ALTOPREV) 20 MG 24 hr tablet, Take 20 mg by mouth at bedtime., Disp: , Rfl:  .  lovastatin (MEVACOR) 20 MG tablet, Take 1 tablet by mouth at  bedtime, Disp: 90 tablet, Rfl: 3 .  Nutritional Supplements (JUICE PLUS FIBRE PO), Take 2 capsules by mouth 2 (two) times daily., Disp: , Rfl:  .  Omega-3 Fatty Acids (FISH OIL) 1200 MG CPDR, Take 1 capsule by mouth once., Disp: , Rfl:  .  traZODone (DESYREL) 150 MG tablet, Take 150 mg by mouth at bedtime as needed for sleep., Disp: , Rfl:   Past Surgical History  Procedure Laterality Date  . Colonoscopy  2012  . Thrombectomy  2001    RT GROIN  . Joint replacement Left 2010    Hip  . Eye surgery Bilateral 2010    cataract surgery  . Total hip arthroplasty Right 05/01/2015    Procedure: RIGHT TOTAL HIP ARTHROPLASTY ANTERIOR APPROACH;  Surgeon: Samson Frederic, MD;  Location: [facility] OR;  Service: Orthopedics;  Laterality: Right;    Family History  Problem Relation Age of Onset  . Hypertension Mother     Allergies  Allergen Reactions  . Morphine And Related Nausea And Vomiting     Review of Systems  CONSTITUTIONAL: No significant weight changes, fever, chills, weakness or fatigue.  HEENT:  - Eyes: No visual changes.  - Ears: No auditory changes. No pain.  - Nose: No sneezing, congestion, runny nose. - Throat: No sore throat. No changes in swallowing. SKIN: No rash or itching.  CARDIOVASCULAR: No chest pain, chest pressure or chest discomfort. No palpitations or edema.  RESPIRATORY: No shortness of breath, cough  or sputum.  NEUROLOGICAL: No headache, dizziness, syncope, paralysis, ataxia, numbness or tingling in the extremities. No memory changes. No change in bowel or bladder control.  MUSCULOSKELETAL: No joint pain. No muscle pain. PSYCHIATRIC: No change in mood. No change in sleep pattern.  ENDOCRINOLOGIC: No reports of sweating, cold or heat intolerance. No polyuria or polydipsia.     Objective  BP 132/78 mmHg  Pulse 85  Temp(Src) 97.8 F (36.6 C) (Oral)  Resp 16  Wt 114 lb 4.8 oz (51.846 kg)  SpO2 98% Body mass index is 21.61 kg/(m^2).  Physical Exam  Constitutional: Patient appears well-developed and well-nourished. In no distress.  HEENT:  - Head: Normocephalic and atraumatic.  - Ears: Bilateral TMs gray, no erythema or effusion - Nose: Nasal mucosa moist - Mouth/Throat: Oropharynx is clear and moist. No tonsillar  hypertrophy or erythema. No post nasal drainage.  - Eyes: Conjunctivae clear, EOM movements normal. PERRLA. No scleral icterus.  Neck: Normal range of motion. Neck supple. No JVD present. No thyromegaly present.  Cardiovascular: Normal rate, regular rhythm and normal heart sounds. No murmur heard.  Pulmonary/Chest: Effort normal and breath sounds normal. No respiratory distress. Musculoskeletal: Normal range of motion bilateral UE and LE, no joint effusions. Peripheral vascular: Bilateral LE no edema. Neurological: CN II-XII grossly intact with no focal deficits. Alert and oriented to person, place, and time. Coordination, balance, strength, speech and gait are normal.  Skin: Skin is warm and dry. No rash noted. No erythema.  Psychiatric: Patient has a normal mood and affect. Behavior is normal in office today. Judgment and thought content normal in office today.   Recent Results (from the past 2160 hour(s))  POCT UA - Microalbumin     Status: Normal   Collection Time: 07/11/15 10:09 AM  Result Value Ref Range   Microalbumin Ur, POC 20 mg/L   Creatinine, POC   mg/dL   Albumin/Creatinine Ratio, Urine, POC    CBC with Differential/Platelet     Status: None   Collection Time: 07/11/15 10:24 AM  Result Value Ref Range   WBC 5.9 3.4 - 10.8 x10E3/uL   RBC 4.53 3.77 - 5.28 x10E6/uL   Hemoglobin 12.8 11.1 - 15.9 g/dL   Hematocrit 16.1 09.6 - 46.6 %   MCV 87 79 - 97 fL   MCH 28.3 26.6 - 33.0 pg   MCHC 32.6 31.5 - 35.7 g/dL   RDW 04.5 40.9 - 81.1 %   Platelets 275 150 - 379 x10E3/uL   Neutrophils 73 %   Lymphs 19 %   Monocytes 5 %   Eos 3 %   Basos 0 %   Neutrophils Absolute 4.3 1.4 - 7.0 x10E3/uL   Lymphocytes Absolute 1.1 0.7 - 3.1 x10E3/uL   Monocytes Absolute 0.3 0.1 - 0.9 x10E3/uL   EOS (ABSOLUTE) 0.2 0.0 - 0.4 x10E3/uL   Basophils Absolute 0.0 0.0 - 0.2 x10E3/uL   Immature Granulocytes 0 %   Immature Grans (Abs) 0.0 0.0 - 0.1 x10E3/uL  Comprehensive metabolic panel     Status: Abnormal   Collection Time: 07/11/15 10:24 AM  Result Value Ref Range   Glucose 87 65 - 99 mg/dL   BUN 16 8 - 27 mg/dL   Creatinine, Ser 9.14 0.57 - 1.00 mg/dL   GFR calc non Af Amer 83 >59 mL/min/1.73   GFR calc Af Amer 95 >59 mL/min/1.73   BUN/Creatinine Ratio 22 11 - 26   Sodium 141 134 - 144 mmol/L   Potassium 5.1 3.5 - 5.2 mmol/L   Chloride 99 97 - 108 mmol/L   CO2 27 18 - 29 mmol/L   Calcium 10.5 (H) 8.7 - 10.3 mg/dL   Total Protein 6.3 6.0 - 8.5 g/dL   Albumin 4.2 3.5 - 4.8 g/dL   Globulin, Total 2.1 1.5 - 4.5 g/dL   Albumin/Globulin Ratio 2.0 1.1 - 2.5   Bilirubin Total 0.4 0.0 - 1.2 mg/dL   Alkaline Phosphatase 62 39 - 117 IU/L   AST 13 0 - 40 IU/L   ALT 12 0 - 32 IU/L  Lipid panel     Status: None   Collection Time: 07/11/15 10:24 AM  Result Value Ref Range   Cholesterol, Total 157 100 - 199 mg/dL   Triglycerides 53 0 - 149 mg/dL   HDL 61 >78 mg/dL    Comment:  According to ATP-III Guidelines, HDL-C >59 mg/dL is considered a negative risk factor for CHD.    VLDL Cholesterol Cal 11 5 - 40 mg/dL   LDL Calculated 85 0 - 99 mg/dL   Chol/HDL  Ratio 2.6 0.0 - 4.4 ratio units    Comment:                                   T. Chol/HDL Ratio                                             Men  Women                               1/2 Avg.Risk  3.4    3.3                                   Avg.Risk  5.0    4.4                                2X Avg.Risk  9.6    7.1                                3X Avg.Risk 23.4   11.0   Hemoglobin A1c     Status: Abnormal   Collection Time: 07/11/15 10:24 AM  Result Value Ref Range   Hgb A1c MFr Bld 7.2 (H) 4.8 - 5.6 %    Comment:          Pre-diabetes: 5.7 - 6.4          Diabetes: >6.4          Glycemic control for adults with diabetes: <7.0    Est. average glucose Bld gHb Est-mCnc 160 mg/dL  TSH     Status: Abnormal   Collection Time: 07/11/15 10:24 AM  Result Value Ref Range   TSH 0.075 (L) 0.450 - 4.500 uIU/mL  T3, free     Status: None   Collection Time: 07/11/15 10:24 AM  Result Value Ref Range   T3, Free 3.3 2.0 - 4.4 pg/mL  T4, free     Status: Abnormal   Collection Time: 07/11/15 10:24 AM  Result Value Ref Range   Free T4 2.04 (H) 0.82 - 1.77 ng/dL  Hepatitis C antibody     Status: None   Collection Time: 07/11/15 10:24 AM  Result Value Ref Range   Hep C Virus Ab <0.1 0.0 - 0.9 s/co ratio    Comment:                                   Negative:     < 0.8                              Indeterminate: 0.8 - 0.9  Positive:     > 0.9  The CDC recommends that a positive HCV antibody result  be followed up with a HCV Nucleic Acid Amplification  test ([id number]).   HIV antibody     Status: None   Collection Time: 07/11/15 10:24 AM  Result Value Ref Range   HIV Screen 4th Generation wRfx Non Reactive Non Reactive     Assessment & Plan  1. Type 2 diabetes, controlled, with peripheral circulatory disorder (HCC) Doing well on transition to Toujeo. Plan to decrease meal time Humalog insulin from 4 units to 3 units with breakfast and dinner to address  hypoglycemia results. Continue 4 units with lunch which is her larger meal.  New regimen:   Humalog vials, 3 units with breakfast, 4 units lunch, 3 units dinner  Continue Toujeo PEN 35 units every morning   2. Hypertension goal BP (blood pressure) < 140/90 Well controled.

## 2015-08-15 ENCOUNTER — Ambulatory Visit: Payer: Medicare Other | Admitting: Family Medicine

## 2015-08-22 ENCOUNTER — Other Ambulatory Visit: Payer: Self-pay

## 2015-08-22 NOTE — Telephone Encounter (Signed)
[facility] this rx should say inject 35 units daily not 25 units, #6  Refill request was sent to Dr. Edwena FeltyAshany Sundaram for approval and submission.

## 2015-08-23 MED ORDER — INSULIN GLARGINE 300 UNIT/ML ~~LOC~~ SOPN: 35.0000 [IU] | PEN_INJECTOR | Freq: Every day | SUBCUTANEOUS | Status: DC

## 2015-08-24 ENCOUNTER — Telehealth: Payer: Self-pay

## 2015-08-24 ENCOUNTER — Telehealth: Payer: Self-pay | Admitting: Family Medicine

## 2015-08-24 NOTE — Telephone Encounter (Signed)
I contacted this patient to inform her that a rx was faxed in on yesterday to the preferred pharmacy. She was informed that if she gets there and they do not have it to have the pharmacist give us a call. She agreed.

## 2015-08-24 NOTE — Telephone Encounter (Signed)
Pt needs refill on Toujea. She has a few to last a few more days. She states she is using a tube within 11 days. [facility] [address]

## 2015-08-24 NOTE — Telephone Encounter (Signed)
Patient stated that her insulin was sent to the wrong pharmacy. She wanted sent to her local pharmacy instead of the mail order.  The rx sent to [facility] was discontinued and the new RX was called in to [facility] [address] [phone]) Spoke to Pharmacist,  Jimmie @ 12:02pm. She then informed me that although I discontinued the order, I still had to call [facility] so that [facility] could process the order. I contacted [facility], spoke to SomaliaSylvia, pharmacist, and cancelled the order for the Toujeo.

## 2015-09-05 ENCOUNTER — Other Ambulatory Visit: Payer: Self-pay | Admitting: Family Medicine

## 2015-09-19 ENCOUNTER — Telehealth: Payer: Self-pay | Admitting: Family Medicine

## 2015-09-19 NOTE — Telephone Encounter (Signed)
Pt would like to know if she can get a sample of the Novolog. Please advise pt.

## 2015-09-21 NOTE — Telephone Encounter (Signed)
Patient given sample of Novolog. She is in donut hole

## 2015-11-07 ENCOUNTER — Other Ambulatory Visit: Payer: Self-pay | Admitting: Family Medicine

## 2015-11-08 ENCOUNTER — Ambulatory Visit (INDEPENDENT_AMBULATORY_CARE_PROVIDER_SITE_OTHER): Payer: Medicare Other | Admitting: Family Medicine

## 2015-11-08 ENCOUNTER — Encounter: Payer: Self-pay | Admitting: Family Medicine

## 2015-11-08 VITALS — BP 130/78 | HR 92 | Temp 97.6°F | Resp 16 | Ht 61.0 in | Wt 114.0 lb

## 2015-11-08 DIAGNOSIS — M25512 Pain in left shoulder: Secondary | ICD-10-CM

## 2015-11-08 DIAGNOSIS — I10 Essential (primary) hypertension: Secondary | ICD-10-CM

## 2015-11-08 DIAGNOSIS — E038 Other specified hypothyroidism: Secondary | ICD-10-CM

## 2015-11-08 DIAGNOSIS — E034 Atrophy of thyroid (acquired): Secondary | ICD-10-CM

## 2015-11-08 DIAGNOSIS — G4700 Insomnia, unspecified: Secondary | ICD-10-CM | POA: Diagnosis not present

## 2015-11-08 DIAGNOSIS — G8929 Other chronic pain: Secondary | ICD-10-CM | POA: Diagnosis not present

## 2015-11-08 DIAGNOSIS — M159 Polyosteoarthritis, unspecified: Secondary | ICD-10-CM

## 2015-11-08 DIAGNOSIS — Z23 Encounter for immunization: Secondary | ICD-10-CM | POA: Diagnosis not present

## 2015-11-08 DIAGNOSIS — E1151 Type 2 diabetes mellitus with diabetic peripheral angiopathy without gangrene: Secondary | ICD-10-CM | POA: Diagnosis not present

## 2015-11-08 MED ORDER — TRAZODONE HCL 100 MG PO TABS: 200.0000 mg | ORAL_TABLET | Freq: Every evening | ORAL | Status: AC | PRN

## 2015-11-08 MED ORDER — INSULIN GLARGINE 300 UNIT/ML ~~LOC~~ SOPN: 35.0000 [IU] | PEN_INJECTOR | Freq: Every day | SUBCUTANEOUS | Status: DC

## 2015-11-08 MED ORDER — MELOXICAM 15 MG PO TABS: 15.0000 mg | ORAL_TABLET | Freq: Every day | ORAL | Status: AC

## 2015-11-08 MED ORDER — INSULIN ASPART 100 UNIT/ML FLEXPEN: PEN_INJECTOR | SUBCUTANEOUS | Status: DC

## 2015-11-08 NOTE — Progress Notes (Signed)
Name: Laura [name]   MRN: [id number]    DOB: [birth year]   Date:11/08/2015       Progress Note  Subjective  Chief Complaint  Chief Complaint  Patient presents with  . Medication Refill    follow-up  . Diabetes    checks glucose 4x day low-50, Avg-130, High-359  . Hypertension  . Hypothyroidism  . Insomnia    patient states trazadone not helping  . Hyperlipidemia  . Shoulder Pain    left onset 1 month unchanged.  patient states unable to sleep on it.  She has had steriod injection in past that has helped     HPI  Laura [name] is a 75 year old female with a know history of hypothyroidism, osteoarthritis, DM II with fluctuating control, atherosclerotic PAD, HTN, recent hip replacement (both replaced now). Today she is following up for DM II. Previous instructions included:  Novolog PEN 3 units with breakfast, dinner. 4 units with lunch. SHE IS NOT DOING THIS. TO MAKE HER MEDICATION LAST SHE IS GIVING HERSELF 1 UNIT WITH BREAKFAST/LUNCH AND 2 UNITS WITH DINNER.  Toujeo PEN 35 units every morning.  Glucose readings at home: Lowest 50 Average 130 Highest 359, mostly with dinner her readings are 200-350.  Eats about 3 peanut butter crackers before bed. Denies hypoglycemic symptoms despite having lowest glucose 50.   Previously she reported being in the donut hole of her insurance coverage and having to stretch out her medications by not using as much or as directed to make her medication last so we decided to optimize her insulin regimen and make it more cost effective.   For HTN she is taking 1 tablet of Benazepril-HCTZ 10-12.5mg  and has kept a blood pressure log which shows BPs systolic range 130-145 and diastolic 60-75. She notes no dizziness, chest pain, fatigue, memory changes.   Today she complains of left shoulder pain, constant, achy. Not associated with chest pain or palpitations.   Trazadone not helping for insomnia.   Past Medical History  Diagnosis Date  .  Hypertension   . Diabetes mellitus without complication (HCC)   . Peripheral vascular disease (HCC)   . Thyroid disease   . Arthritis   . Atherosclerosis of native arteries of the extremities, unspecified   . 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 PONV (postoperative nausea and vomiting)     Patient Active Problem List   Diagnosis Date Noted  . Screening for STD (sexually transmitted disease) 07/11/2015  . Osteoarthrosis, generalized, involving multiple sites 06/08/2015  . Adult hypothyroidism 06/08/2015  . Insomnia, controlled 06/08/2015  . Hypertension goal BP (blood pressure) < 140/90 06/08/2015  . Chronic left shoulder pain 06/08/2015  . History of bilateral hip replacements 05/01/2015  . Type 2 diabetes, controlled, with peripheral circulatory disorder (HCC) 04/28/2015  . Atherosclerotic peripheral vascular disease with intermittent claudication (HCC) 08/18/2013    Social History  Substance Use Topics  . Smoking status: Former Smoker -- 1.00 packs/day for 20 years    Quit date: 04/21/2003  . Smokeless tobacco: Never Used  . Alcohol Use: No     Current outpatient prescriptions:  .  Insulin Glargine (TOUJEO SOLOSTAR [place]), Inject 35 Units into the skin 1 day or 1 dose., Disp: , Rfl:  .  ACCU-CHEK AVIVA PLUS test strip, Use as instructed 4 times  daily, Disp: 400 each, Rfl: 3 .  aspirin EC 81 MG tablet, Take 81 mg by mouth daily., Disp: , Rfl:  .  calcium-vitamin D (OSCAL WITH D) 500-200 MG-UNIT per tablet, Take 1 tablet by mouth 2 (two) times daily., Disp: , Rfl:  .  Cyanocobalamin (B-12) 5000 MCG CAPS, Take 1 capsule by mouth daily., Disp: , Rfl:  .  fluticasone (FLONASE) 50 MCG/ACT nasal spray, Place 2 sprays into both nostrils daily., Disp: 16 g, Rfl: 5 .  insulin lispro (HUMALOG) 100 UNIT/ML injection, Inject 4 Units into the skin 3 (three) times daily before meals., Disp: , Rfl:  .  Insulin Pen Needle (NOVOFINE PLUS) 32G X 4 MM MISC, 1 Device by Does not apply  route daily., Disp: 30 each, Rfl: 1 .  Insulin Pen Needle 31G X 5 MM MISC, 1 Device by Does not apply route 3 (three) times daily., Disp: 100 each, Rfl: 1 .  levothyroxine (SYNTHROID, LEVOTHROID) 50 MCG tablet, Take 1 tablet by mouth  daily, Disp: 90 tablet, Rfl: 1 .  lisinopril-hydrochlorothiazide (PRINZIDE,ZESTORETIC) 10-12.5 MG per tablet, Take 1 tablet by mouth daily., Disp: 90 tablet, Rfl: 3 .  lovastatin (MEVACOR) 20 MG tablet, Take 1 tablet by mouth at  bedtime, Disp: 90 tablet, Rfl: 3 .  meloxicam (MOBIC) 7.5 MG tablet, Take 1 tablet by mouth  daily, Disp: 90 tablet, Rfl: 1 .  Nutritional Supplements (JUICE PLUS FIBRE PO), Take 2 capsules by mouth 2 (two) times daily., Disp: , Rfl:  .  Omega-3 Fatty Acids (FISH OIL) 1200 MG CPDR, Take 1 capsule by mouth once., Disp: , Rfl:  .  traZODone (DESYREL) 150 MG tablet, Take 150 mg by mouth at bedtime as needed for sleep., Disp: , Rfl:   Past Surgical History  Procedure Laterality Date  . Colonoscopy  2012  . Thrombectomy  2001    RT GROIN  . Joint replacement Left 2010    Hip  . Eye surgery Bilateral 2010    cataract surgery  . Total hip arthroplasty Right 05/01/2015    Procedure: RIGHT TOTAL HIP ARTHROPLASTY ANTERIOR APPROACH;  Surgeon: Samson Frederic, MD;  Location: [facility] OR;  Service: Orthopedics;  Laterality: Right;    Family History  Problem Relation Age of Onset  . Hypertension Mother     Allergies  Allergen Reactions  . Morphine And Related Nausea And Vomiting     Review of Systems  CONSTITUTIONAL: No significant weight changes, fever, chills, weakness or fatigue.  CARDIOVASCULAR: No chest pain, chest pressure or chest discomfort. No palpitations or edema.  RESPIRATORY: No shortness of breath, cough or sputum.  NEUROLOGICAL: No headache, dizziness, syncope, paralysis, ataxia, numbness or tingling in the extremities. No memory changes. No change in bowel or bladder control.  MUSCULOSKELETAL: No joint pain. No muscle  pain. HEMATOLOGIC: No anemia, bleeding or bruising.  LYMPHATICS: No enlarged lymph nodes.  PSYCHIATRIC: No change in mood. No change in sleep pattern.  ENDOCRINOLOGIC: No reports of sweating, cold or heat intolerance. No polyuria or polydipsia.     Objective  BP 130/78 mmHg  Pulse 92  Temp(Src) 97.6 F (36.4 C) (Oral)  Resp 16  Ht 5\' 1"  (1.549 m)  Wt 114 lb (51.71 kg)  BMI 21.55 kg/m2  SpO2 96% Body mass index is 21.55 kg/(m^2).  Physical Exam  Constitutional: Patient appears well-developed and well-nourished. In no distress.  Neck: Normal range of motion. Neck supple. No JVD present. No thyromegaly present.  Cardiovascular: Normal rate, regular rhythm and normal heart sounds. No murmur heard.  Pulmonary/Chest: Effort normal and breath sounds normal. No respiratory distress. Musculoskeletal: Normal range of motion bilateral UE and  LE, no joint effusions. Full ROM at left shoulder. Peripheral vascular: Bilateral LE no edema. Neurological: CN II-XII grossly intact with no focal deficits. Alert and oriented to person, place, and time. Coordination, balance, strength, speech and gait are normal.  Skin: Skin is warm and dry. No rash noted. No erythema.  Psychiatric: Patient has a normal mood and affect. Behavior is normal in office today. Judgment and thought content normal in office today.  Assessment & Plan  1. Type 2 diabetes, controlled, with peripheral circulatory disorder (HCC) Self adjusting medication to make it last longer due to financial constraints. Clarified appropriate dosing schedule to be: Toujeo 35 units in the morning with Novolog 1/3/4 units with b/l/d.   - Hemoglobin A1c - Insulin Glargine (TOUJEO SOLOSTAR) 300 UNIT/ML SOPN; Inject 35 Units into the skin daily.  Dispense: 7 pen; Refill: 2 - insulin aspart (NOVOLOG FLEXPEN) 100 UNIT/ML FlexPen; Inject 1 unit with breakfast, 3 units with lunch, 4 units with dinner.  Dispense: 3 pen; Refill: 2  2.  Hypertension goal BP (blood pressure) < 140/90 Well controlled.   3. Hypothyroidism due to acquired atrophy of thyroid Due for blood work since we changed her thyroid dose a few months ago.  - TSH - T3, free - T4, free  4. Need for Tdap vaccination  - Tdap vaccine greater than or equal to 7yo IM  5. Need for pneumococcal vaccination  - Pneumococcal polysaccharide vaccine 23-valent greater than or equal to 2yo subcutaneous/IM  6. Osteoarthrosis, generalized, involving multiple sites Increased meloxicam dose.  - meloxicam (MOBIC) 15 MG tablet; Take 1 tablet (15 mg total) by mouth daily.  Dispense: 90 tablet; Refill: 2  7. Chronic left shoulder pain Increased meloxicam dose. If symptoms persist f/u with Ortho.  - meloxicam (MOBIC) 15 MG tablet; Take 1 tablet (15 mg total) by mouth daily.  Dispense: 90 tablet; Refill: 2  8. Insomnia, uncontrolled Increased trazodone total dose from 150 mg to 200 mg.   - traZODone (DESYREL) 100 MG tablet; Take 2 tablets (200 mg total) by mouth at bedtime as needed for sleep.  Dispense: 180 tablet; Refill: 2

## 2015-11-09 ENCOUNTER — Other Ambulatory Visit: Payer: Self-pay | Admitting: Family Medicine

## 2015-11-09 LAB — TSH: TSH: 0.901 u[IU]/mL (ref 0.450–4.500)

## 2015-11-09 LAB — T4, FREE: Free T4: 1.6 ng/dL (ref 0.82–1.77)

## 2015-11-09 LAB — T3, FREE: T3 FREE: 2.7 pg/mL (ref 2.0–4.4)

## 2015-11-09 LAB — HEMOGLOBIN A1C
ESTIMATED AVERAGE GLUCOSE: 223 mg/dL
HEMOGLOBIN A1C: 9.4 % — AB (ref 4.8–5.6)

## 2015-11-14 ENCOUNTER — Other Ambulatory Visit: Payer: Self-pay | Admitting: Family Medicine

## 2015-11-14 ENCOUNTER — Telehealth: Payer: Self-pay

## 2015-11-14 ENCOUNTER — Ambulatory Visit: Payer: Self-pay | Admitting: General Surgery

## 2015-11-14 DIAGNOSIS — IMO0002 Reserved for concepts with insufficient information to code with codable children: Secondary | ICD-10-CM

## 2015-11-14 DIAGNOSIS — E1165 Type 2 diabetes mellitus with hyperglycemia: Principal | ICD-10-CM

## 2015-11-14 DIAGNOSIS — E1151 Type 2 diabetes mellitus with diabetic peripheral angiopathy without gangrene: Secondary | ICD-10-CM

## 2015-11-14 MED ORDER — INSULIN LISPRO 100 UNIT/ML (KWIKPEN): PEN_INJECTOR | SUBCUTANEOUS | Status: DC

## 2015-11-14 NOTE — Telephone Encounter (Signed)
Patient called states her ins does not cover novolog, so she needs new rx for Humalog pen sent to her mail order pharmacy

## 2015-11-14 NOTE — Telephone Encounter (Signed)
[facility] Rx [facility]. Same directions as previous Novolog.

## 2015-11-30 ENCOUNTER — Encounter: Payer: Self-pay | Admitting: General Surgery

## 2015-11-30 ENCOUNTER — Ambulatory Visit (INDEPENDENT_AMBULATORY_CARE_PROVIDER_SITE_OTHER): Payer: Medicare Other | Admitting: General Surgery

## 2015-11-30 VITALS — BP 122/70 | HR 74 | Resp 12 | Ht 61.0 in | Wt 116.0 lb

## 2015-11-30 DIAGNOSIS — I739 Peripheral vascular disease, unspecified: Secondary | ICD-10-CM | POA: Diagnosis not present

## 2015-11-30 NOTE — Patient Instructions (Signed)
Patient to return in 1 year for follow up of PAD.

## 2015-11-30 NOTE — Progress Notes (Signed)
Patient ID: Laura [name], female   DOB: 04-Mar-1941, 75 y.o.   MRN: [id number]  Chief Complaint  Patient presents with  . Follow-up    PAD    HPI Laura [name] is a 75 y.o. female here today for her one year follow up vascular evaluation. She is s/p R iliac artery embolectomy over 12 years ago. Pt denies any leg pain, swelling or skin changes  HPI I have reviewed the history of present illness with the patient.  Past Medical History  Diagnosis Date  . Hypertension   . Diabetes mellitus without complication (HCC)   . Peripheral vascular disease (HCC)   . Thyroid disease   . Arthritis   . Atherosclerosis of native arteries of the extremities, unspecified   . 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 PONV (postoperative nausea and vomiting)     Past Surgical History  Procedure Laterality Date  . Colonoscopy  2012  . Thrombectomy  2001    RT GROIN  . Joint replacement Left 2010    Hip  . Eye surgery Bilateral 2010    cataract surgery  . Total hip arthroplasty Right 05/01/2015    Procedure: RIGHT TOTAL HIP ARTHROPLASTY ANTERIOR APPROACH;  Surgeon: Samson Frederic, MD;  Location: [facility] OR;  Service: Orthopedics;  Laterality: Right;    Family History  Problem Relation Age of Onset  . Hypertension Mother     Social History Social History  Substance Use Topics  . Smoking status: Former Smoker -- 1.00 packs/day for 20 years    Quit date: 04/21/2003  . Smokeless tobacco: Never Used  . Alcohol Use: No    Allergies  Allergen Reactions  . Morphine And Related Nausea And Vomiting    Current Outpatient Prescriptions  Medication Sig Dispense Refill  . ACCU-CHEK AVIVA PLUS test strip Use as instructed 4 times  daily 400 each 3  . aspirin EC 81 MG tablet Take 81 mg by mouth daily.    . calcium-vitamin D (OSCAL WITH D) 500-200 MG-UNIT per tablet Take 1 tablet by mouth 2 (two) times daily.    . Cyanocobalamin (B-12) 5000 MCG CAPS Take 1 capsule by mouth daily.    .  fluticasone (FLONASE) 50 MCG/ACT nasal spray Place 2 sprays into both nostrils daily. 16 g 5  . Insulin Glargine (TOUJEO SOLOSTAR) 300 UNIT/ML SOPN Inject 35 Units into the skin daily. 7 pen 2  . insulin lispro (HUMALOG KWIKPEN) 100 UNIT/ML KiwkPen Inject [place] 1u q breakfast, 3u q lunch, 4u q dinner 3 pen 2  . Insulin Pen Needle (NOVOFINE PLUS) 32G X 4 MM MISC 1 Device by Does not apply route daily. 30 each 1  . Insulin Pen Needle 31G X 5 MM MISC 1 Device by Does not apply route 3 (three) times daily. 100 each 1  . levothyroxine (SYNTHROID, LEVOTHROID) 50 MCG tablet Take 1 tablet by mouth  daily 90 tablet 1  . lisinopril-hydrochlorothiazide (PRINZIDE,ZESTORETIC) 10-12.5 MG per tablet Take 1 tablet by mouth daily. 90 tablet 3  . lovastatin (MEVACOR) 20 MG tablet Take 1 tablet by mouth at  bedtime 90 tablet 3  . meloxicam (MOBIC) 15 MG tablet Take 1 tablet (15 mg total) by mouth daily. 90 tablet 2  . Nutritional Supplements (JUICE PLUS FIBRE PO) Take 2 capsules by mouth 2 (two) times daily.    . Omega-3 Fatty Acids (FISH OIL) 1200 MG CPDR Take 1 capsule by mouth once.    . traZODone (DESYREL) 100  MG tablet Take 2 tablets (200 mg total) by mouth at bedtime as needed for sleep. 180 tablet 2   No current facility-administered medications for this visit.    Review of Systems Review of Systems  Blood pressure 122/70, pulse 74, resp. rate 12, height  (1.549 m), weight 116 lb (52.617 kg).  Physical Exam Physical Exam  Constitutional: She is oriented to person, place, and time. She appears well-developed and well-nourished.  Eyes: Conjunctivae are normal. No scleral icterus.  Neck: Neck supple.  Cardiovascular: Normal rate, regular rhythm, normal heart sounds and intact distal pulses.   Pulses:      Carotid pulses are 2+ on the right side, and 2+ on the left side.      Radial pulses are 2+ on the right side, and 2+ on the left side.       Femoral pulses are 2+ on the right side, and 2+ on the  left side.      Dorsalis pedis pulses are 2+ on the right side, and 1+ on the left side.       Posterior tibial pulses are 0 on the right side, and 2+ on the left side.  Feet are warm, pink, good capillary refill. No edema. No VV  Pulmonary/Chest: Effort normal and breath sounds normal.  Abdominal: Soft. Normal appearance and bowel sounds are normal. There is no tenderness.  Neurological: She is alert and oriented to person, place, and time.  Skin: Skin is warm and dry.    Data Reviewed Notes reviewed.  Assessment    Mild PAD-involving tibial arteries. Asymptomatic and stable    Plan    Pt to return in 1 year for follow up of PAD.     PCP:  Sherley Bounds This information has been scribed by Ples Specter CMA.   SANKAR,SEEPLAPUTHUR G 12/05/2015, 9:31 AM

## 2015-12-05 ENCOUNTER — Encounter: Payer: Self-pay | Admitting: General Surgery

## 2015-12-19 DIAGNOSIS — Z96641 Presence of right artificial hip joint: Secondary | ICD-10-CM | POA: Diagnosis not present

## 2015-12-19 DIAGNOSIS — Z471 Aftercare following joint replacement surgery: Secondary | ICD-10-CM | POA: Diagnosis not present

## 2015-12-22 ENCOUNTER — Telehealth: Payer: Self-pay | Admitting: Family Medicine

## 2015-12-22 DIAGNOSIS — IMO0002 Reserved for concepts with insufficient information to code with codable children: Secondary | ICD-10-CM

## 2015-12-22 DIAGNOSIS — E1165 Type 2 diabetes mellitus with hyperglycemia: Principal | ICD-10-CM

## 2015-12-22 DIAGNOSIS — E1151 Type 2 diabetes mellitus with diabetic peripheral angiopathy without gangrene: Secondary | ICD-10-CM

## 2015-12-22 NOTE — Telephone Encounter (Signed)
Patient is requesting that you adjust her humalog insulin pen. States that she feels that she need 5 units in the morning, 8 units at lunch, and 8 units at dinner. [facility]. Also need refills on Toujeo. Requesting return call once this has been taken care of.

## 2015-12-24 MED ORDER — INSULIN LISPRO 100 UNIT/ML (KWIKPEN): PEN_INJECTOR | SUBCUTANEOUS | Status: AC

## 2015-12-24 MED ORDER — INSULIN GLARGINE 300 UNIT/ML ~~LOC~~ SOPN: 35.0000 [IU] | PEN_INJECTOR | Freq: Every day | SUBCUTANEOUS | Status: AC

## 2015-12-24 NOTE — Telephone Encounter (Signed)
Done

## 2015-12-25 NOTE — Telephone Encounter (Signed)
Left voicemail rx has been sent in.

## 2016-02-13 ENCOUNTER — Encounter: Payer: Medicare Other | Attending: Internal Medicine | Admitting: Dietician

## 2016-02-13 VITALS — BP 145/49 | Ht 61.0 in | Wt 116.8 lb

## 2016-02-13 DIAGNOSIS — E119 Type 2 diabetes mellitus without complications: Secondary | ICD-10-CM | POA: Diagnosis not present

## 2016-02-13 DIAGNOSIS — Z794 Long term (current) use of insulin: Secondary | ICD-10-CM

## 2016-02-13 NOTE — Progress Notes (Signed)
Diabetes Self-Management Education  Visit Type: First/Initial  Appt. Start Time: 1030 Appt. End Time: 1130  02/13/2016  Ms. Laura [name] [name], identified by name and date of birth, is a 75 y.o. female with a diagnosis of Diabetes: Type 2 (history of diabetes x9 years).   ASSESSMENT  Blood pressure 145/49, height 5\' 1"  (1.549 m), weight 116 lb 12.8 oz (52.98 kg). Body mass index is 22.08 kg/(m^2). Lacks knowledge of healthy diet       Diabetes Self-Management Education - 02/13/16 1218    Visit Information   Visit Type First/Initial   Initial Visit   Diabetes Type Type 2  history of diabetes x9 years   Health Coping   How would you rate your overall health? Good   Psychosocial Assessment   Patient Belief/Attitude about Diabetes Motivated to manage diabetes   Self-care barriers None   Patient Concerns Glycemic Control   Special Needs None   Preferred Learning Style Auditory   Learning Readiness Ready   Complications   Last HgB A1C per patient/outside source 8.7 %  01-30-16   How often do you check your blood sugar? --  4x/day   Fasting Blood glucose range (mg/dL) --  pt reports FBG's mostly 100's-160's with 56x1; ac lunch 70's-150's; ac supper 200's; bedtime 170's-200's   Number of hypoglycemic episodes per month 1   Have you had a dilated eye exam in the past 12 months? Yes  goes every 6 months-has appt 1 wk.   Have you had a dental exam in the past 12 months? Yes  appt 1 week-goes evry 6 months   Are you checking your feet? Yes   How many days per week are you checking your feet? 3   Dietary Intake   Breakfast --  eats 3 meals/day   Snack (morning) --  occasionally eats handful of p-nuts   Snack (afternoon) --  occasionally eats handful of p-nuts    Snack (evening) --  eats sugar free ice cream at bedtime   Beverage(s) --  drinks water 6-7x/day and unsweet tea + diet sodas 6-7x/day   Exercise   Exercise Type --  walks 30 min 2x/wk + does yardwork (advised not to  exercise within first 2 hours after taking Humalog)   Patient Education   Previous Diabetes Education No   Disease state  --  discussed type 2 diabetes and importance of good BG control to lower risk of complications along with treatment options   Nutrition management  Role of diet in the treatment of diabetes and the relationship between the three main macronutrients and blood glucose level;Food label reading, portion sizes and measuring food.;Carbohydrate counting   Physical activity and exercise  Role of exercise on diabetes management, blood pressure control and cardiac health.;Helped patient identify appropriate exercises in relation to his/her diabetes, diabetes complications and other health issue.   Medications Reviewed patients medication for diabetes, action, purpose, timing of dose and side effects.;Taught/reviewed insulin injection, site rotation, insulin storage and needle disposal.   Monitoring Purpose and frequency of SMBG.;Identified appropriate SMBG and/or A1C goals.;Taught/discussed recording of test results and interpretation of SMBG.;Yearly dilated eye exam   Acute complications Taught treatment of hypoglycemia - the 15 rule.  gave pt medical alert ID card to carry   Chronic complications Relationship between chronic complications and blood glucose control;Dental care;Retinopathy and reason for yearly dilated eye exams   Personal strategies to promote health Lifestyle issues that need to be addressed for better diabetes care  Individualized Plan for Diabetes Self-Management Training:   Learning Objective:  Patient will have a greater understanding of diabetes self-management. Patient education plan is to attend individual and/or group sessions per assessed needs and concerns.   Plan:   Patient Instructions   Check blood sugars 4 x day before each meal and before bed every day  Exercise: walk  for    30  minutes   3  days a week (do not exercise within first 2 hr  after Humalog shot)  Avoid sugar sweetened drinks (soda, tea, coffee, sports drinks, juices)  Limit intake of sweets and fried foods   Eat 3 meals day,   1 snacks a day at bedtime  Space meals 4-5 hours apart  Get a Games developer fast acting glucose and a snack at all times  Rotate injection sites  Call me with blood sugars 02-19-16  Return for appointment/classes on:  03-11-16   Expected Outcomes:   positive  Education material provided: general meal planning Guidelines, Medical alert ID card  If problems or questions, patient to contact team via:  [phone]  Future DSME appointment:  03-11-16

## 2016-02-13 NOTE — Patient Instructions (Signed)
  Check blood sugars 4 x day before each meal and before bed every day  Exercise: walk  for    30  minutes   3  days a week (do not exercise within first 2 hr after Humalog shot)  Avoid sugar sweetened drinks (soda, tea, coffee, sports drinks, juices)  Limit intake of sweets and fried foods   Eat 3 meals day,   1 snacks a day at bedtime  Space meals 4-5 hours apart  Get a Games developerharps container  Carry fast acting glucose and a snack at all times  Rotate injection sites  Call me with blood sugars 02-19-16  Return for appointment/classes on:  03-11-16

## 2016-02-19 ENCOUNTER — Encounter: Payer: Self-pay | Admitting: Dietician

## 2016-02-19 NOTE — Progress Notes (Signed)
Pt called with BG's with recent FBG's 61-82, ac lunch 64-194, ac supper 66-294, bedtime 114-153. Fax sent to Dr Judie PetitM. Hyacinth MeekerMiller to advise on decreasing Toujeo.

## 2016-02-20 ENCOUNTER — Telehealth: Payer: Self-pay | Admitting: Dietician

## 2016-02-20 NOTE — Telephone Encounter (Signed)
Received fax from Dr. Paul HalfM. Miller to decrease Toujeo to 30 units daily. Called pt and advised of this change.

## 2016-02-28 ENCOUNTER — Other Ambulatory Visit: Payer: Self-pay

## 2016-02-28 DIAGNOSIS — E1151 Type 2 diabetes mellitus with diabetic peripheral angiopathy without gangrene: Secondary | ICD-10-CM

## 2016-02-28 NOTE — Telephone Encounter (Signed)
Patient has established with another practice Rx denied

## 2016-03-07 ENCOUNTER — Encounter: Payer: Medicare Other | Attending: Internal Medicine | Admitting: Dietician

## 2016-03-07 ENCOUNTER — Encounter: Payer: Self-pay | Admitting: Dietician

## 2016-03-07 VITALS — Wt 116.8 lb

## 2016-03-07 DIAGNOSIS — E119 Type 2 diabetes mellitus without complications: Secondary | ICD-10-CM | POA: Insufficient documentation

## 2016-03-07 DIAGNOSIS — Z794 Long term (current) use of insulin: Secondary | ICD-10-CM

## 2016-03-07 NOTE — Progress Notes (Signed)

## 2016-03-14 ENCOUNTER — Encounter: Payer: Self-pay | Admitting: *Deleted

## 2016-03-14 ENCOUNTER — Encounter: Payer: Medicare Other | Admitting: *Deleted

## 2016-03-14 VITALS — Wt 116.2 lb

## 2016-03-14 DIAGNOSIS — E119 Type 2 diabetes mellitus without complications: Secondary | ICD-10-CM

## 2016-03-14 DIAGNOSIS — Z794 Long term (current) use of insulin: Principal | ICD-10-CM

## 2016-03-14 NOTE — Progress Notes (Signed)

## 2016-03-21 ENCOUNTER — Encounter: Payer: Self-pay | Admitting: Dietician

## 2016-03-21 ENCOUNTER — Encounter: Payer: Medicare Other | Admitting: Dietician

## 2016-03-21 NOTE — Progress Notes (Signed)

## 2016-03-26 ENCOUNTER — Encounter: Payer: Self-pay | Admitting: Dietician

## 2016-06-07 IMAGING — CR DG HIP (WITH OR WITHOUT PELVIS) 2-3V*L*
1 series · 3 of 3 positions shown · non-contrast
Comparison: MRI of the left hip January 05, 2013

CLINICAL DATA: Status post fall onto the cement floor yesterday
striking the left hip; history of left hip replacement in 7343; the
patient has had chronic left hip pain prior to the injury.

EXAM:
LEFT HIP (WITH PELVIS) 2-3 VIEWS

[Series 1: dg hip unilat with pelvis 2-3 views left · 0.14mm/px · 3 of 3 slices shown]
[im 1/3]
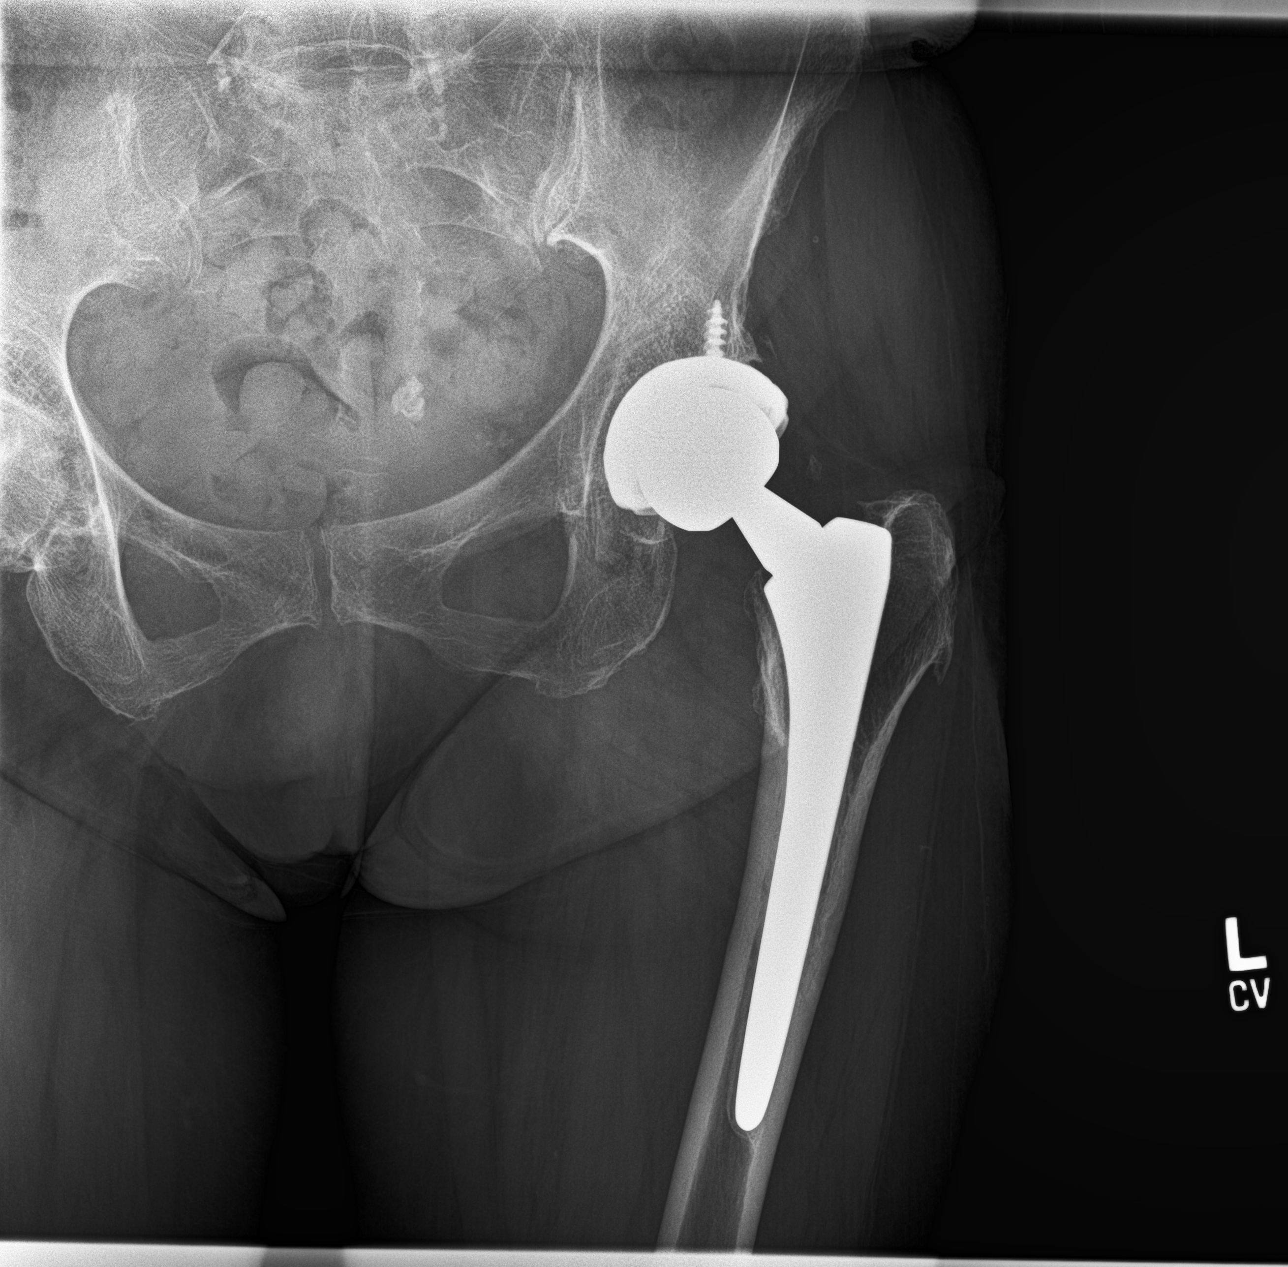
[im 2/3]
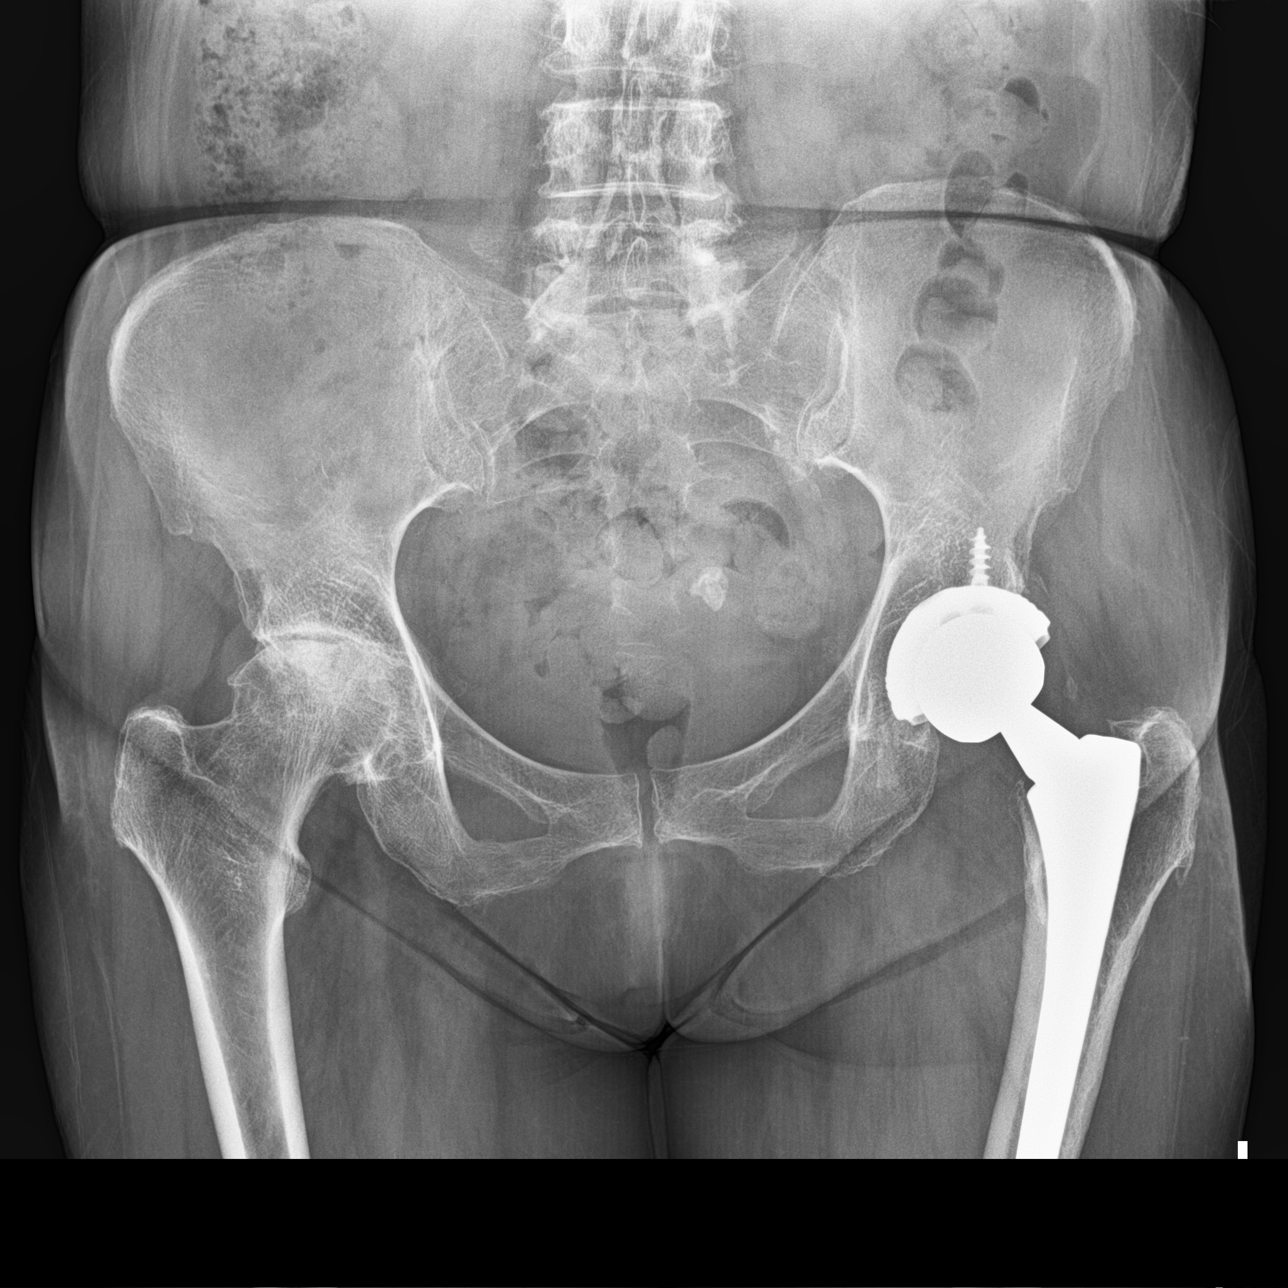
[im 3/3]
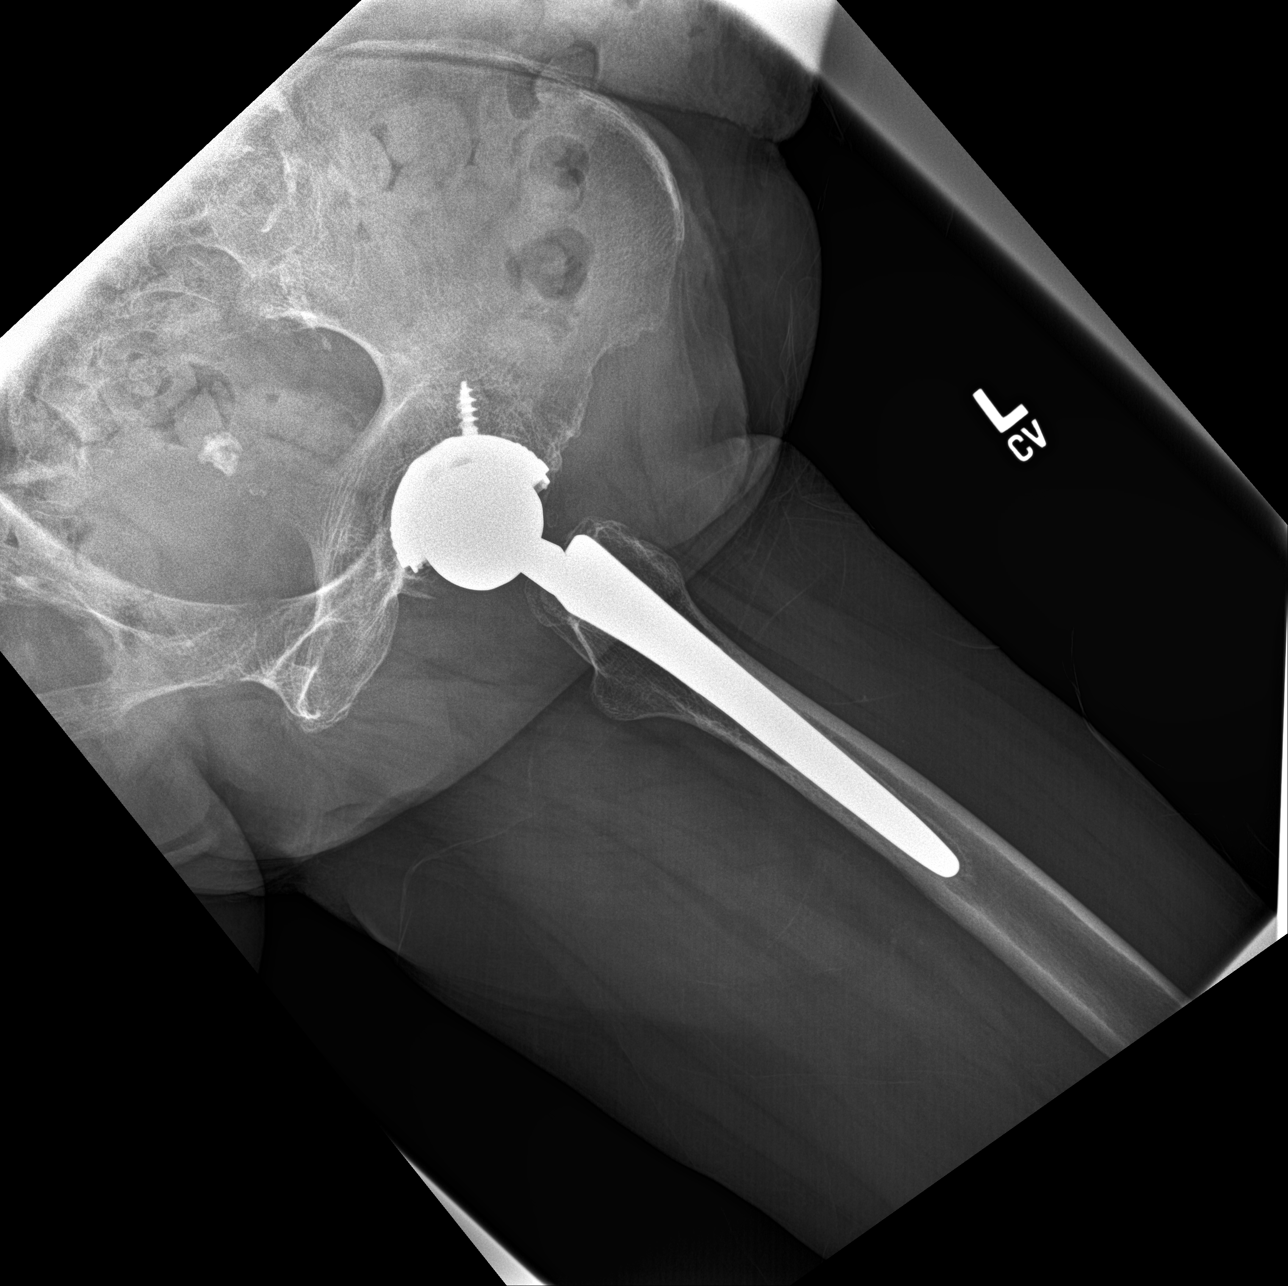

[3 of 3 positions shown; findings below may reference images not displayed]

FINDINGS: The bony pelvis is mildly osteopenic. No acute fracture is
demonstrated. There is moderate to severe osteoarthritic change of
the right hip joint.

There is a prosthetic left hip joint. The metallic hardware appears
intact. The interface with the native bone is normal. The overlying
soft tissues are unremarkable.
IMPRESSION: 1. There is no acute abnormality of the bony pelvis nor of the
prosthetic left hip.
2. There is moderate severe degenerative change of the right hip
joint.

## 2016-07-18 IMAGING — CR DG PORTABLE PELVIS
1 series · 1 of 1 positions shown · non-contrast
Comparison: None.

CLINICAL DATA: Initial encounter for right hip replacement.

EXAM:
PORTABLE PELVIS 1-2 VIEWS

[AP]
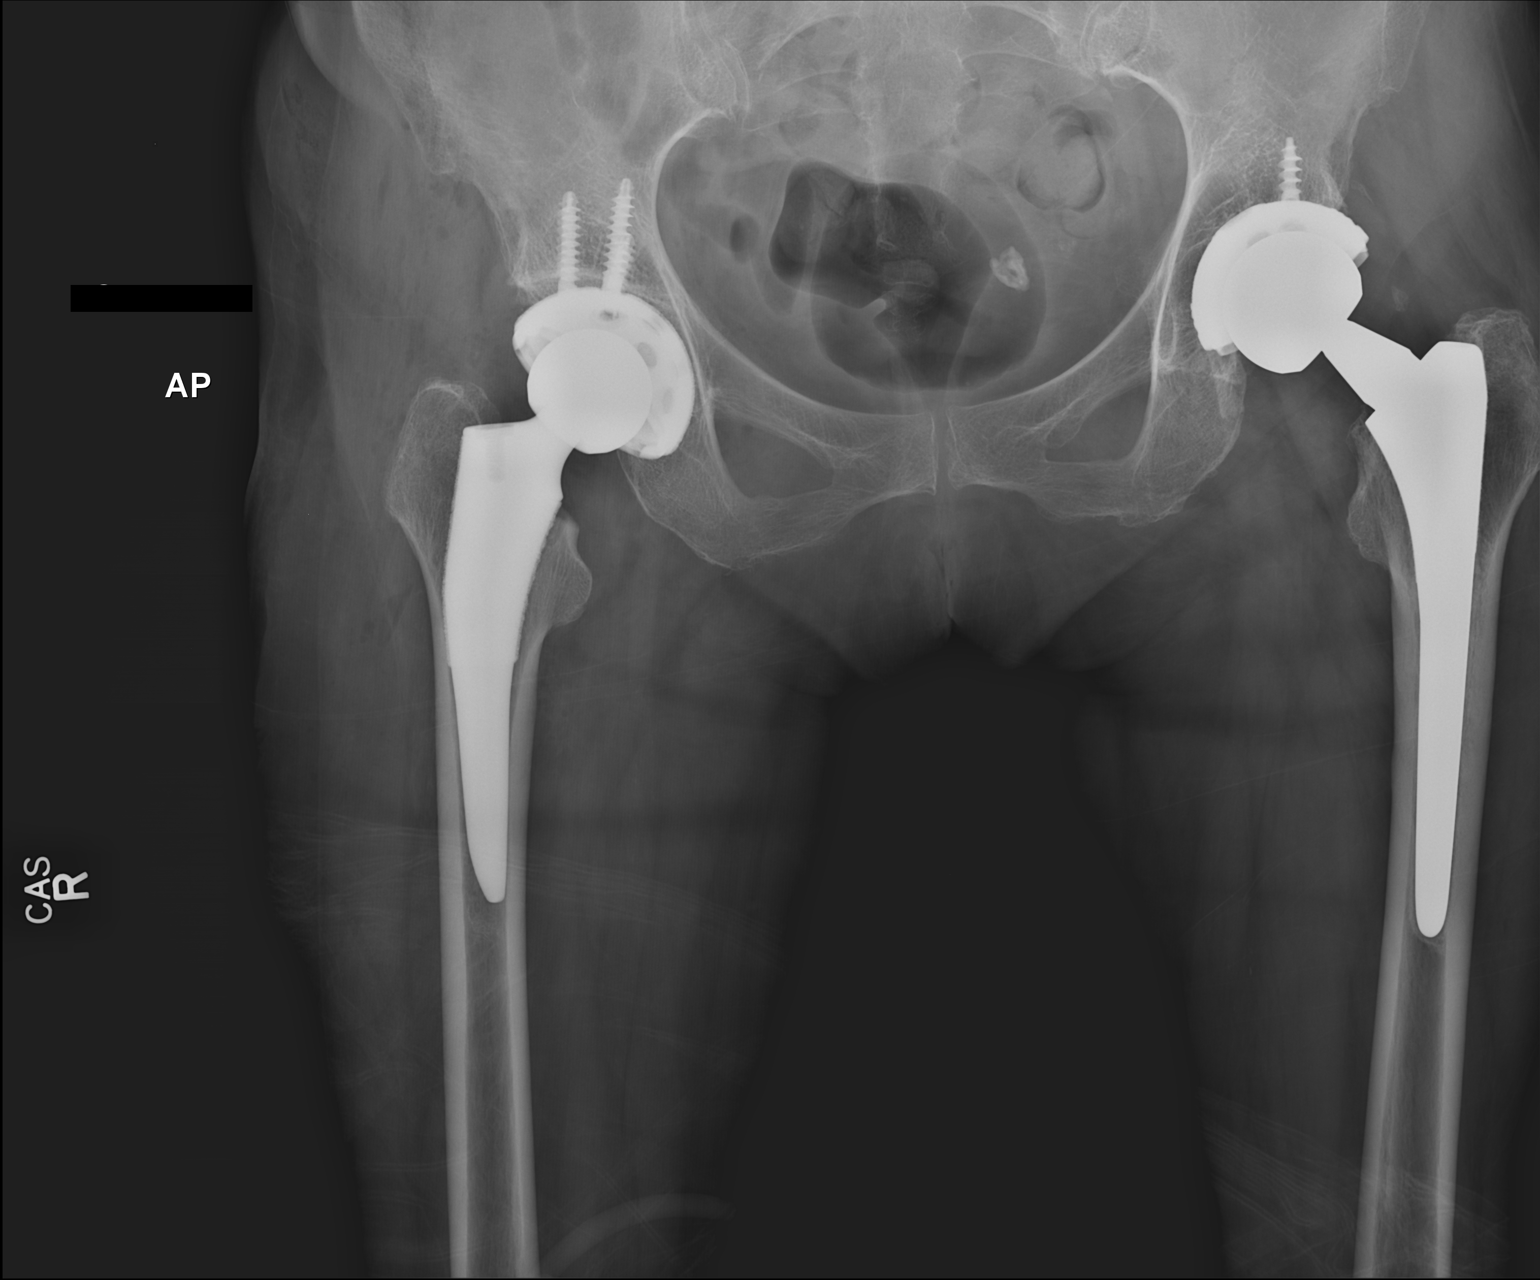

[1 of 1 positions shown; findings below may reference images not displayed]

FINDINGS: AP view of the right hip shows the patient to be status post total
hip replacement. No evidence for immediate hardware complications.
Left hip prosthesis is evident. Bones are diffusely demineralized.
IMPRESSION: No evidence for immediate hardware complications after right total
hip replacement.

## 2016-09-04 ENCOUNTER — Other Ambulatory Visit: Payer: Self-pay | Admitting: Internal Medicine

## 2016-09-04 DIAGNOSIS — Z1231 Encounter for screening mammogram for malignant neoplasm of breast: Secondary | ICD-10-CM

## 2016-10-30 ENCOUNTER — Ambulatory Visit
Admission: RE | Admit: 2016-10-30 | Discharge: 2016-10-30 | Disposition: A | Discharge status: Home / Self Care | Payer: Medicare Other | Source: Ambulatory Visit | Attending: Internal Medicine | Admitting: Internal Medicine

## 2016-10-30 DIAGNOSIS — Z1231 Encounter for screening mammogram for malignant neoplasm of breast: Secondary | ICD-10-CM | POA: Diagnosis present

## 2016-10-31 ENCOUNTER — Ambulatory Visit: Payer: Medicare Other

## 2016-11-26 ENCOUNTER — Ambulatory Visit: Payer: Medicare Other | Admitting: General Surgery

## 2016-11-28 ENCOUNTER — Encounter: Payer: Self-pay | Admitting: *Deleted

## 2016-12-04 ENCOUNTER — Ambulatory Visit (INDEPENDENT_AMBULATORY_CARE_PROVIDER_SITE_OTHER): Payer: Medicare Other | Admitting: General Surgery

## 2016-12-04 ENCOUNTER — Encounter: Payer: Self-pay | Admitting: General Surgery

## 2016-12-04 VITALS — BP 126/56 | HR 78 | Resp 12 | Ht 61.0 in | Wt 126.0 lb

## 2016-12-04 DIAGNOSIS — I739 Peripheral vascular disease, unspecified: Secondary | ICD-10-CM | POA: Diagnosis not present

## 2016-12-04 NOTE — Progress Notes (Signed)
Patient ID: Laura [name], female   DOB: 1941-09-03, 76 y.o.   MRN: [id number]  Chief Complaint  Patient presents with  . Follow-up    PAD    HPI Laura [name] is a 76 y.o. female.  here today for her one year follow up vascular evaluation. She is s/p R iliac artery embolectomy over 16 years ago. Pt denies any leg pain, swelling or skin changes.  I have reviewed the history of present illness with the patient.  HPI  Past Medical History:  Diagnosis Date  . Arthritis   . Atherosclerosis of native arteries of the extremities, unspecified   . Diabetes mellitus without complication (HCC)   . 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 Hypertension   . Peripheral vascular disease (HCC)   . PONV (postoperative nausea and vomiting)   . Thyroid disease     Past Surgical History:  Procedure Laterality Date  . COLONOSCOPY  2012  . EYE SURGERY Bilateral 2010   cataract surgery  . JOINT REPLACEMENT Left 2010   Hip  . THROMBECTOMY  2001   RT GROIN  . TOTAL HIP ARTHROPLASTY Right 05/01/2015   Procedure: RIGHT TOTAL HIP ARTHROPLASTY ANTERIOR APPROACH;  Surgeon: Samson FredericBrian Swinteck, MD;  Location: [facility] OR;  Service: Orthopedics;  Laterality: Right;    Family History  Problem Relation Age of Onset  . Hypertension Mother   . Breast cancer Neg Hx     Social History Social History  Substance Use Topics  . Smoking status: Former Smoker    Packs/day: 1.00    Years: 20.00    Quit date: 04/21/2003  . Smokeless tobacco: Never Used  . Alcohol use No    Allergies  Allergen Reactions  . Morphine And Related Nausea And Vomiting    Current Outpatient Prescriptions  Medication Sig Dispense Refill  . ACCU-CHEK AVIVA PLUS test strip Use as instructed 4 times  daily 400 each 3  . aspirin EC 325 MG tablet Take 1 tablet by mouth daily.    . Calcium Carbonate-Vitamin D (CALCIUM-VITAMIN D) 500-200 MG-UNIT tablet Take 1 tablet by mouth daily.    . Cyanocobalamin (B-12) 5000 MCG CAPS Take 1  capsule by mouth daily.    . fluticasone (FLONASE) 50 MCG/ACT nasal spray Place 2 sprays into both nostrils daily. (Patient taking differently: Place 2 sprays into both nostrils daily as needed. ) 16 g 5  . Insulin Glargine (TOUJEO SOLOSTAR) 300 UNIT/ML SOPN Inject 35 Units into the skin daily. 7 pen 1  . insulin lispro (HUMALOG KWIKPEN) 100 UNIT/ML KiwkPen Inject [place] 5u q breakfast, 8u q lunch, 8u q dinner 7 pen 1  . Insulin Pen Needle (NOVOFINE PLUS) 32G X 4 MM MISC 1 Device by Does not apply route daily. 30 each 1  . Insulin Pen Needle 31G X 5 MM MISC 1 Device by Does not apply route 3 (three) times daily. 100 each 1  . levothyroxine (SYNTHROID, LEVOTHROID) 50 MCG tablet Take 1 tablet by mouth  daily 90 tablet 1  . lisinopril-hydrochlorothiazide (PRINZIDE,ZESTORETIC) 10-12.5 MG per tablet Take 1 tablet by mouth daily. 90 tablet 3  . lovastatin (MEVACOR) 20 MG tablet Take 1 tablet by mouth at  bedtime 90 tablet 3  . meloxicam (MOBIC) 15 MG tablet Take 1 tablet (15 mg total) by mouth daily. (Patient taking differently: Take 15 mg by mouth daily as needed. ) 90 tablet 2  . Nutritional Supplements (JUICE PLUS FIBRE PO) Take 2 capsules by  mouth 2 (two) times daily.    . Omega-3 Fatty Acids (FISH OIL) 1200 MG CPDR Take 1 capsule by mouth once.    . traZODone (DESYREL) 100 MG tablet Take 2 tablets (200 mg total) by mouth at bedtime as needed for sleep. 180 tablet 2  . doxazosin (CARDURA) 2 MG tablet      No current facility-administered medications for this visit.     Review of Systems Review of Systems  Constitutional: Negative.   Respiratory: Negative.   Cardiovascular: Negative.     Blood pressure (!) 126/56, pulse 78, resp. rate 12, height 5\' 1"  (1.549 m), weight 126 lb (57.2 kg).  Physical Exam Physical Exam  Constitutional: She is oriented to person, place, and time. She appears well-developed and well-nourished.  Eyes: Conjunctivae are normal. No scleral icterus.  Neck: Neck supple.   Cardiovascular: Normal rate, regular rhythm and normal heart sounds.   Pulses:      Carotid pulses are 2+ on the right side, and 2+ on the left side.      Radial pulses are 2+ on the right side, and 2+ on the left side.       Femoral pulses are 1+ on the right side with bruit, and 2+ on the left side with bruit.      Dorsalis pedis pulses are 1+ on the right side, and 1+ on the left side.       Posterior tibial pulses are 1+ on the right side, and 2+ on the left side.  No lower extremity edema.Feet are warm and pink, capillary refill is normal  Pulmonary/Chest: Effort normal and breath sounds normal.  Abdominal: Soft. Normal appearance and bowel sounds are normal. There is no tenderness.  Lymphadenopathy:    She has no cervical adenopathy.  Neurological: She is alert and oriented to person, place, and time.  Skin: Skin is warm and dry.  Psychiatric: Her behavior is normal.    Data Reviewed Prior notes  Assessment  Post-operative iliac arterial embolectomy. Mild PAD on right leg. Asymptomatic and stable    Plan    Pt to follow up in one year with [facility].  Educated patient on symptoms of an embolus/thrombus and claudication and advised to seek immediate help if this happens.     This information has been scribed by Dorathy Daft RN, BSN,BC.  SANKAR,SEEPLAPUTHUR G 12/04/2016, 10:16 AM

## 2016-12-04 NOTE — Patient Instructions (Addendum)
The patient is aware to call back for any questions or concerns. Pt to follow up in one year with [facility].

## 2017-10-03 ENCOUNTER — Other Ambulatory Visit: Payer: Self-pay | Admitting: Internal Medicine

## 2017-10-03 DIAGNOSIS — Z1231 Encounter for screening mammogram for malignant neoplasm of breast: Secondary | ICD-10-CM

## 2017-11-06 ENCOUNTER — Ambulatory Visit
Admission: RE | Admit: 2017-11-06 | Discharge: 2017-11-06 | Disposition: A | Discharge status: Home / Self Care | Payer: Medicare Other | Source: Ambulatory Visit | Attending: Internal Medicine | Admitting: Internal Medicine

## 2017-11-06 DIAGNOSIS — Z1231 Encounter for screening mammogram for malignant neoplasm of breast: Secondary | ICD-10-CM

## 2018-12-17 ENCOUNTER — Other Ambulatory Visit: Payer: Self-pay | Admitting: Internal Medicine

## 2018-12-17 DIAGNOSIS — Z1231 Encounter for screening mammogram for malignant neoplasm of breast: Secondary | ICD-10-CM

## 2019-01-18 ENCOUNTER — Other Ambulatory Visit: Payer: Self-pay

## 2019-01-18 ENCOUNTER — Ambulatory Visit
Admission: RE | Admit: 2019-01-18 | Discharge: 2019-01-18 | Disposition: A | Discharge status: Home / Self Care | Payer: Medicare Other | Source: Ambulatory Visit | Attending: Internal Medicine | Admitting: Internal Medicine

## 2019-01-18 DIAGNOSIS — Z1231 Encounter for screening mammogram for malignant neoplasm of breast: Secondary | ICD-10-CM | POA: Diagnosis present

## 2019-12-29 ENCOUNTER — Other Ambulatory Visit: Payer: Self-pay | Admitting: Internal Medicine

## 2019-12-29 DIAGNOSIS — Z1231 Encounter for screening mammogram for malignant neoplasm of breast: Secondary | ICD-10-CM

## 2020-02-16 ENCOUNTER — Ambulatory Visit
Admission: RE | Admit: 2020-02-16 | Discharge: 2020-02-16 | Disposition: A | Discharge status: Home / Self Care | Payer: Medicare Other | Source: Ambulatory Visit | Attending: Internal Medicine | Admitting: Internal Medicine

## 2020-02-16 DIAGNOSIS — Z1231 Encounter for screening mammogram for malignant neoplasm of breast: Secondary | ICD-10-CM | POA: Insufficient documentation

## 2020-10-06 ENCOUNTER — Other Ambulatory Visit
Admission: RE | Admit: 2020-10-06 | Discharge: 2020-10-06 | Disposition: A | Discharge status: Home / Self Care | Payer: Medicare Other | Source: Ambulatory Visit | Attending: Orthopedic Surgery | Admitting: Orthopedic Surgery

## 2020-10-06 DIAGNOSIS — Z96642 Presence of left artificial hip joint: Secondary | ICD-10-CM | POA: Diagnosis present

## 2020-10-13 LAB — MISC LABCORP TEST (SEND OUT): Labcorp test code: 738770

## 2020-10-16 LAB — MISC LABCORP TEST (SEND OUT): Labcorp test code: 792620

## 2021-01-26 ENCOUNTER — Other Ambulatory Visit: Payer: Self-pay | Admitting: Internal Medicine

## 2021-01-26 DIAGNOSIS — Z1231 Encounter for screening mammogram for malignant neoplasm of breast: Secondary | ICD-10-CM

## 2021-03-07 ENCOUNTER — Other Ambulatory Visit: Payer: Self-pay

## 2021-03-07 ENCOUNTER — Ambulatory Visit
Admission: RE | Admit: 2021-03-07 | Discharge: 2021-03-07 | Disposition: A | Discharge status: Home / Self Care | Payer: Medicare Other | Source: Ambulatory Visit | Attending: Internal Medicine | Admitting: Internal Medicine

## 2021-03-07 DIAGNOSIS — Z1231 Encounter for screening mammogram for malignant neoplasm of breast: Secondary | ICD-10-CM | POA: Insufficient documentation

## 2021-05-05 IMAGING — MG DIGITAL SCREENING BILAT W/ TOMO W/ CAD
6 of 10 series · 6 of 30 positions shown · non-contrast
Comparison: Previous exam(s).

CLINICAL DATA: Screening.

EXAM:
DIGITAL SCREENING BILATERAL MAMMOGRAM WITH TOMO AND CAD

[R MLO synth-2D]
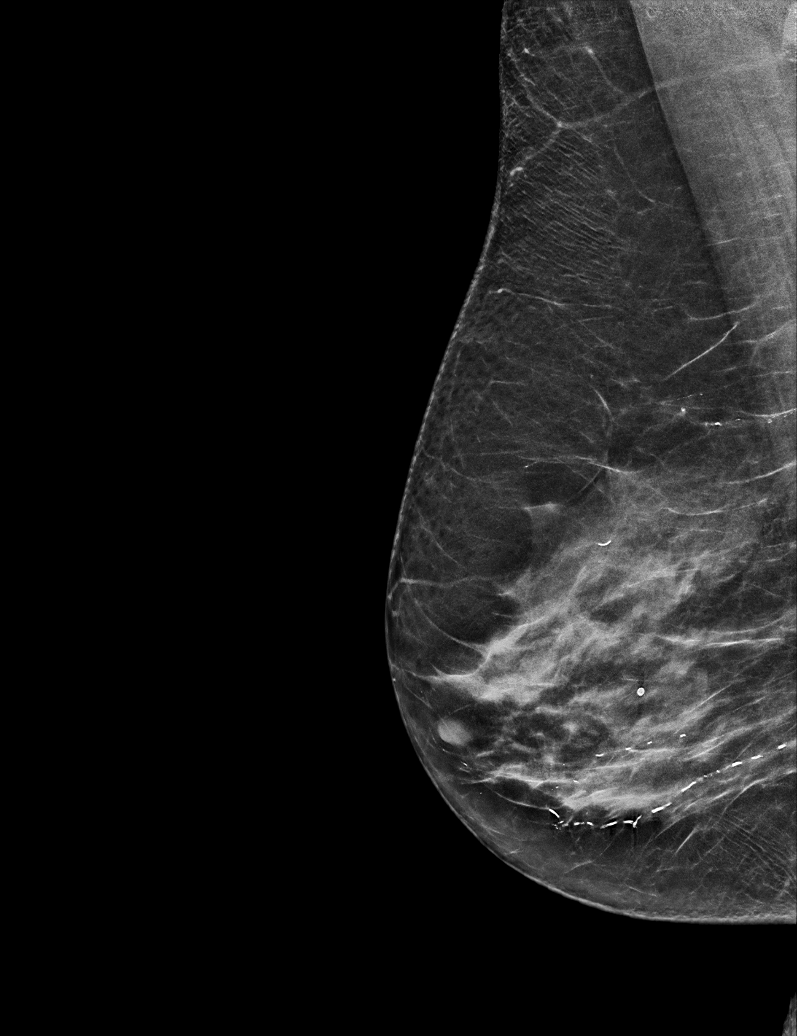

[R CC synth-2D]
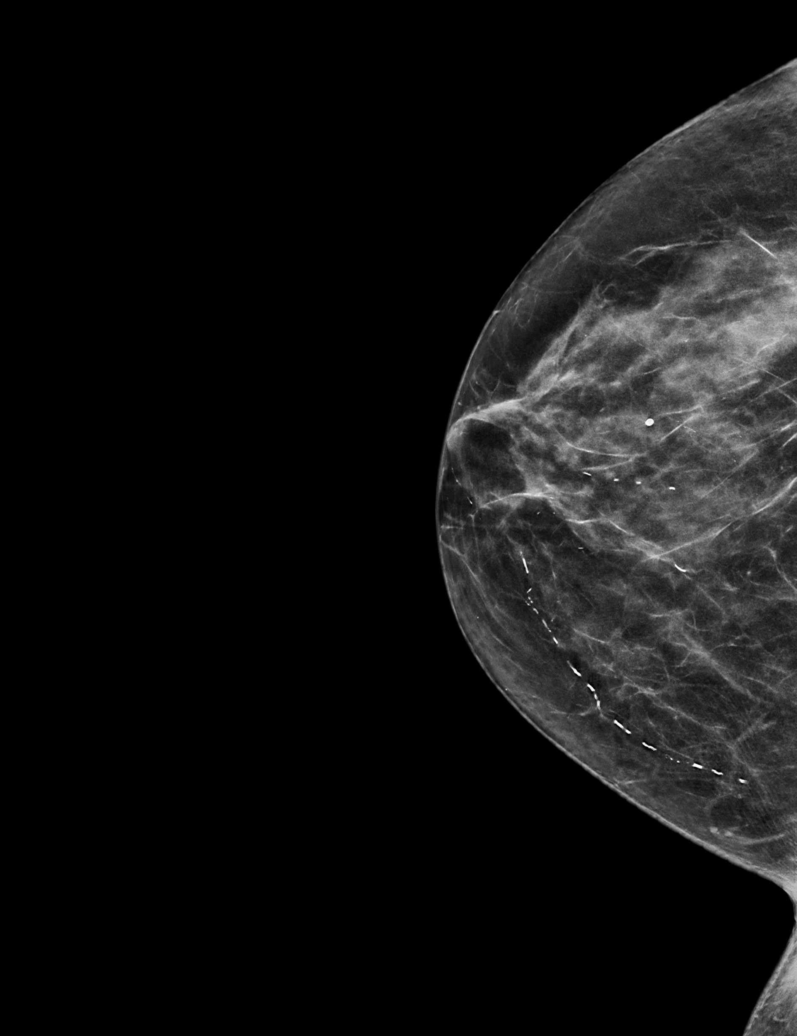

[R XCCL synth-2D]
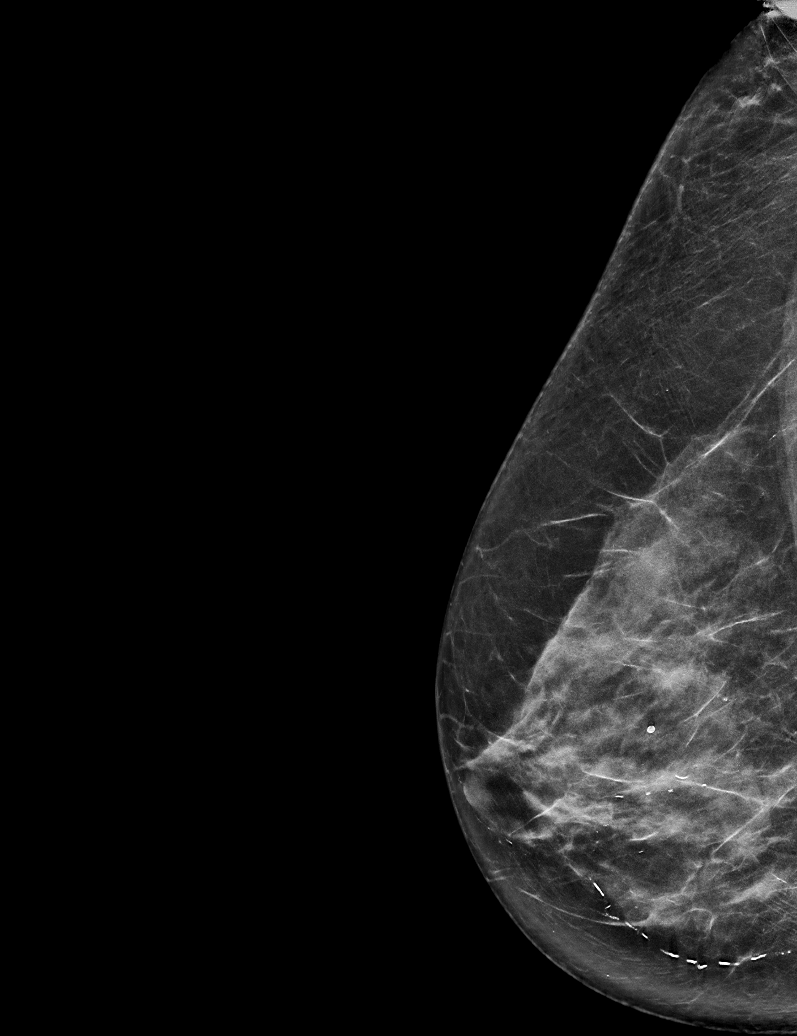

[L CC synth-2D]
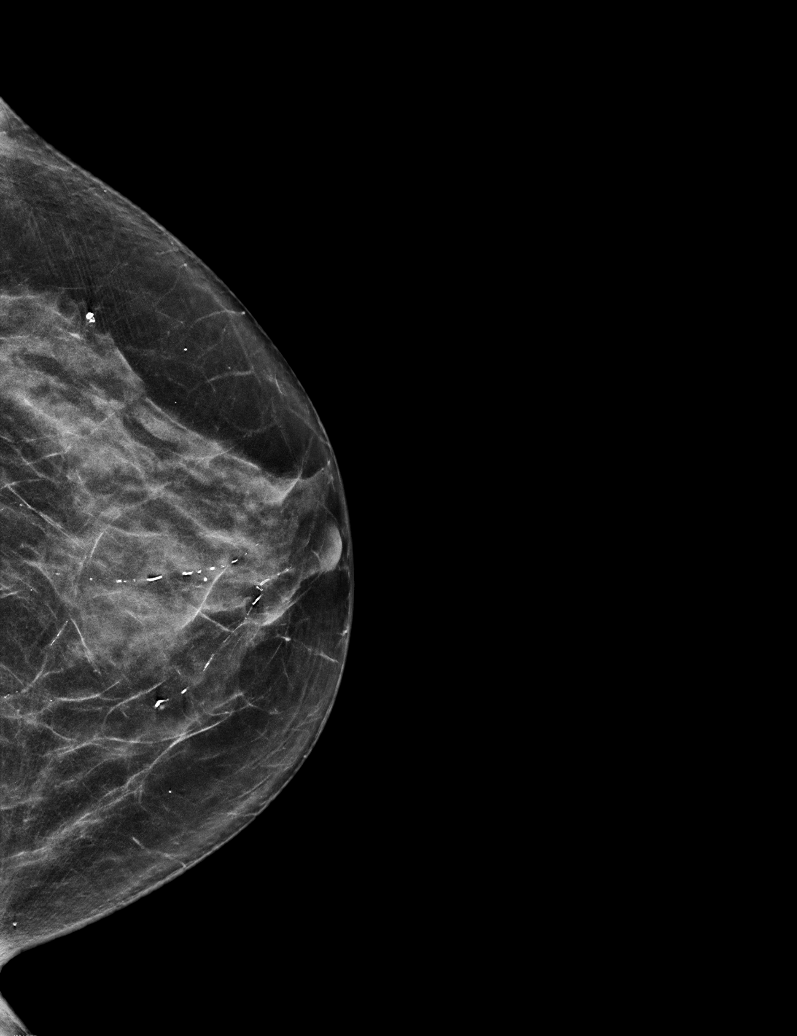

[L MLO synth-2D]
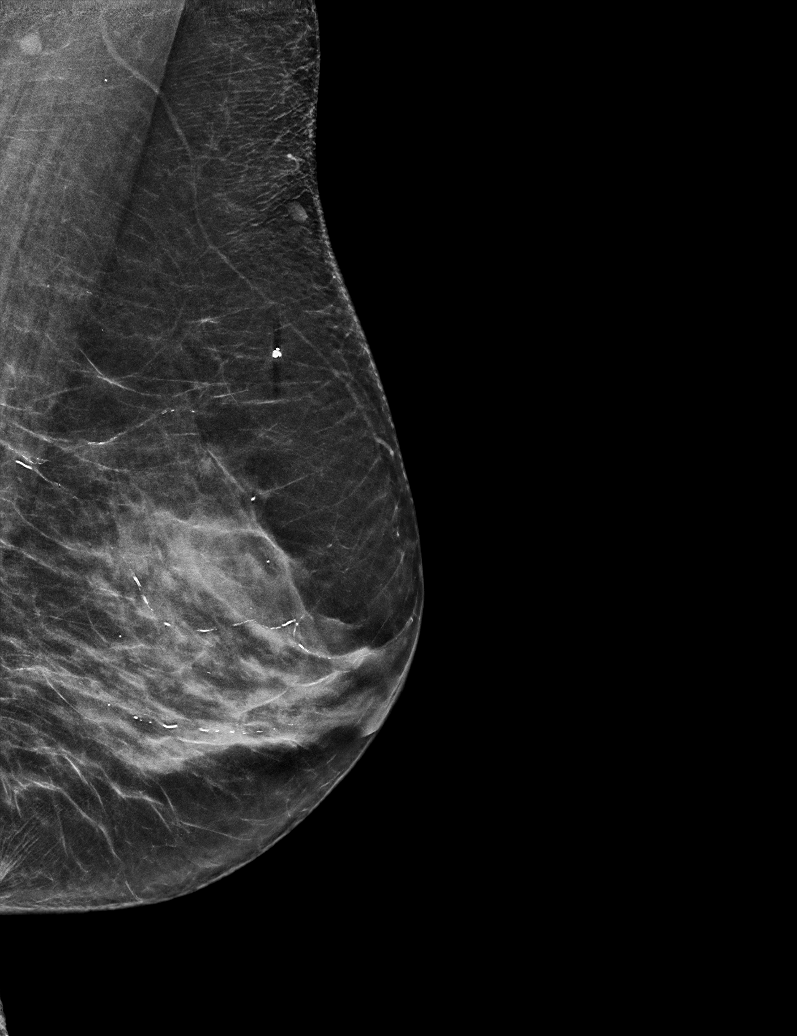

[R XCCL tomo · tomo slice 31/61.0]
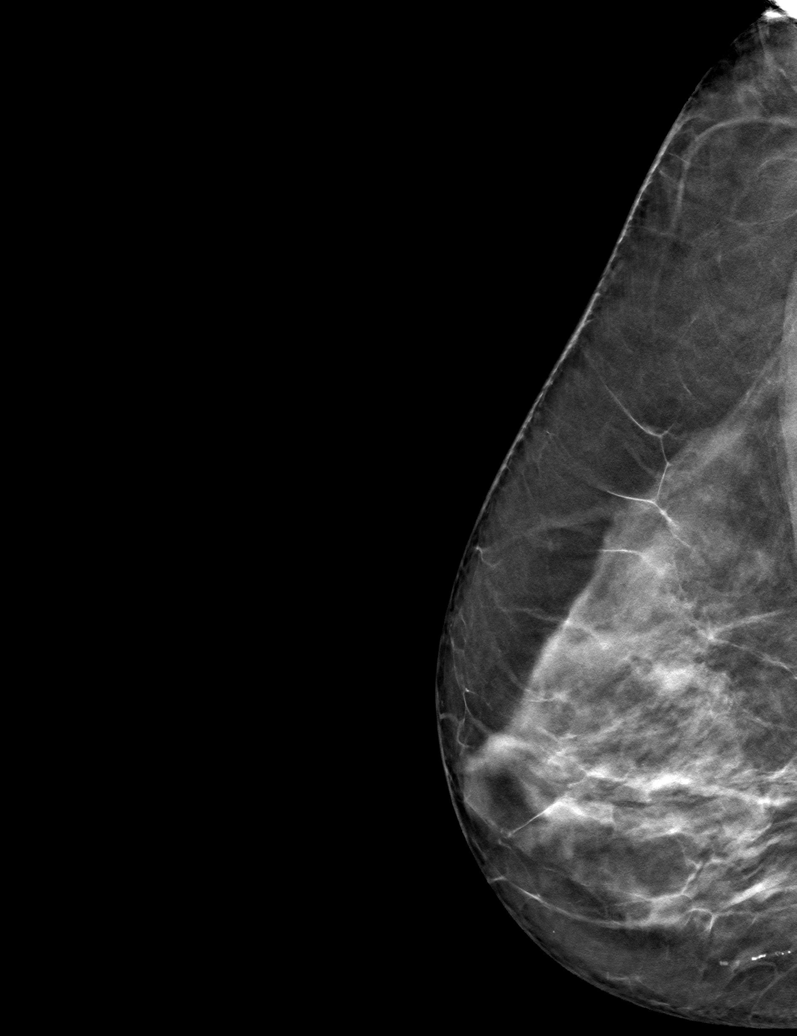

[6 of 30 positions shown; findings below may reference images not displayed]

ACR Breast Density Category c: The breast tissue is heterogeneously
dense, which may obscure small masses.
FINDINGS: There are no findings suspicious for malignancy. Images were
processed with CAD.
IMPRESSION: No mammographic evidence of malignancy. A result letter of this
screening mammogram will be mailed directly to the patient.

RECOMMENDATION:
Screening mammogram in one year. (Code:FT-U-LHB)

BI-RADS CATEGORY  1: Negative.

## 2022-02-06 ENCOUNTER — Other Ambulatory Visit: Payer: Self-pay | Admitting: Internal Medicine

## 2022-02-06 DIAGNOSIS — Z1231 Encounter for screening mammogram for malignant neoplasm of breast: Secondary | ICD-10-CM

## 2022-03-20 ENCOUNTER — Ambulatory Visit
Admission: RE | Admit: 2022-03-20 | Discharge: 2022-03-20 | Disposition: A | Discharge status: Home / Self Care | Payer: Medicare Other | Source: Ambulatory Visit | Attending: Internal Medicine | Admitting: Internal Medicine

## 2022-03-20 DIAGNOSIS — Z1231 Encounter for screening mammogram for malignant neoplasm of breast: Secondary | ICD-10-CM

## 2023-03-17 ENCOUNTER — Other Ambulatory Visit: Payer: Self-pay | Admitting: Internal Medicine

## 2023-03-17 DIAGNOSIS — Z1231 Encounter for screening mammogram for malignant neoplasm of breast: Secondary | ICD-10-CM

## 2023-04-15 ENCOUNTER — Ambulatory Visit
Admission: RE | Admit: 2023-04-15 | Discharge: 2023-04-15 | Disposition: A | Discharge status: Home / Self Care | Payer: Medicare Other | Source: Ambulatory Visit | Attending: Internal Medicine | Admitting: Internal Medicine

## 2023-04-15 DIAGNOSIS — Z1231 Encounter for screening mammogram for malignant neoplasm of breast: Secondary | ICD-10-CM | POA: Diagnosis present

## 2023-11-12 DIAGNOSIS — E1151 Type 2 diabetes mellitus with diabetic peripheral angiopathy without gangrene: Secondary | ICD-10-CM | POA: Diagnosis not present

## 2023-11-12 DIAGNOSIS — E782 Mixed hyperlipidemia: Secondary | ICD-10-CM | POA: Diagnosis not present

## 2023-11-12 DIAGNOSIS — H353112 Nonexudative age-related macular degeneration, right eye, intermediate dry stage: Secondary | ICD-10-CM | POA: Diagnosis not present

## 2023-11-18 DIAGNOSIS — H353211 Exudative age-related macular degeneration, right eye, with active choroidal neovascularization: Secondary | ICD-10-CM | POA: Diagnosis not present

## 2023-11-18 DIAGNOSIS — H353122 Nonexudative age-related macular degeneration, left eye, intermediate dry stage: Secondary | ICD-10-CM | POA: Diagnosis not present

## 2023-11-18 DIAGNOSIS — H43813 Vitreous degeneration, bilateral: Secondary | ICD-10-CM | POA: Diagnosis not present

## 2023-11-19 DIAGNOSIS — E1151 Type 2 diabetes mellitus with diabetic peripheral angiopathy without gangrene: Secondary | ICD-10-CM | POA: Diagnosis not present

## 2023-11-19 DIAGNOSIS — I10 Essential (primary) hypertension: Secondary | ICD-10-CM | POA: Diagnosis not present

## 2023-11-19 DIAGNOSIS — I739 Peripheral vascular disease, unspecified: Secondary | ICD-10-CM | POA: Diagnosis not present

## 2023-11-19 DIAGNOSIS — Z0000 Encounter for general adult medical examination without abnormal findings: Secondary | ICD-10-CM | POA: Diagnosis not present

## 2023-12-12 DIAGNOSIS — H353112 Nonexudative age-related macular degeneration, right eye, intermediate dry stage: Secondary | ICD-10-CM | POA: Diagnosis not present

## 2023-12-30 DIAGNOSIS — H353211 Exudative age-related macular degeneration, right eye, with active choroidal neovascularization: Secondary | ICD-10-CM | POA: Diagnosis not present

## 2023-12-30 DIAGNOSIS — H43813 Vitreous degeneration, bilateral: Secondary | ICD-10-CM | POA: Diagnosis not present

## 2024-01-11 DIAGNOSIS — H353112 Nonexudative age-related macular degeneration, right eye, intermediate dry stage: Secondary | ICD-10-CM | POA: Diagnosis not present

## 2024-01-13 DIAGNOSIS — H353221 Exudative age-related macular degeneration, left eye, with active choroidal neovascularization: Secondary | ICD-10-CM | POA: Diagnosis not present

## 2024-01-27 DIAGNOSIS — E113393 Type 2 diabetes mellitus with moderate nonproliferative diabetic retinopathy without macular edema, bilateral: Secondary | ICD-10-CM | POA: Diagnosis not present

## 2024-02-17 DIAGNOSIS — L570 Actinic keratosis: Secondary | ICD-10-CM | POA: Diagnosis not present

## 2024-02-17 DIAGNOSIS — D2271 Melanocytic nevi of right lower limb, including hip: Secondary | ICD-10-CM | POA: Diagnosis not present

## 2024-02-17 DIAGNOSIS — L821 Other seborrheic keratosis: Secondary | ICD-10-CM | POA: Diagnosis not present

## 2024-02-17 DIAGNOSIS — D2262 Melanocytic nevi of left upper limb, including shoulder: Secondary | ICD-10-CM | POA: Diagnosis not present

## 2024-02-17 DIAGNOSIS — D2261 Melanocytic nevi of right upper limb, including shoulder: Secondary | ICD-10-CM | POA: Diagnosis not present

## 2024-02-17 DIAGNOSIS — D2272 Melanocytic nevi of left lower limb, including hip: Secondary | ICD-10-CM | POA: Diagnosis not present

## 2024-02-17 DIAGNOSIS — D225 Melanocytic nevi of trunk: Secondary | ICD-10-CM | POA: Diagnosis not present

## 2024-02-24 DIAGNOSIS — H43813 Vitreous degeneration, bilateral: Secondary | ICD-10-CM | POA: Diagnosis not present

## 2024-02-24 DIAGNOSIS — H353231 Exudative age-related macular degeneration, bilateral, with active choroidal neovascularization: Secondary | ICD-10-CM | POA: Diagnosis not present

## 2024-03-15 ENCOUNTER — Other Ambulatory Visit: Payer: Self-pay | Admitting: Internal Medicine

## 2024-03-15 DIAGNOSIS — Z1231 Encounter for screening mammogram for malignant neoplasm of breast: Secondary | ICD-10-CM

## 2024-04-06 DIAGNOSIS — H353231 Exudative age-related macular degeneration, bilateral, with active choroidal neovascularization: Secondary | ICD-10-CM | POA: Diagnosis not present

## 2024-04-06 DIAGNOSIS — H43813 Vitreous degeneration, bilateral: Secondary | ICD-10-CM | POA: Diagnosis not present

## 2024-04-21 ENCOUNTER — Ambulatory Visit
Admission: RE | Admit: 2024-04-21 | Discharge: 2024-04-21 | Disposition: A | Discharge status: Home / Self Care | Source: Ambulatory Visit | Attending: Internal Medicine | Admitting: Internal Medicine

## 2024-04-21 DIAGNOSIS — Z1231 Encounter for screening mammogram for malignant neoplasm of breast: Secondary | ICD-10-CM | POA: Diagnosis not present

## 2024-05-19 DIAGNOSIS — E1151 Type 2 diabetes mellitus with diabetic peripheral angiopathy without gangrene: Secondary | ICD-10-CM | POA: Diagnosis not present

## 2024-05-19 DIAGNOSIS — E782 Mixed hyperlipidemia: Secondary | ICD-10-CM | POA: Diagnosis not present

## 2024-05-27 DIAGNOSIS — E1151 Type 2 diabetes mellitus with diabetic peripheral angiopathy without gangrene: Secondary | ICD-10-CM | POA: Diagnosis not present

## 2024-05-27 DIAGNOSIS — I739 Peripheral vascular disease, unspecified: Secondary | ICD-10-CM | POA: Diagnosis not present

## 2024-05-27 DIAGNOSIS — Z0000 Encounter for general adult medical examination without abnormal findings: Secondary | ICD-10-CM | POA: Diagnosis not present

## 2024-05-27 DIAGNOSIS — M5416 Radiculopathy, lumbar region: Secondary | ICD-10-CM | POA: Diagnosis not present

## 2024-05-27 DIAGNOSIS — R202 Paresthesia of skin: Secondary | ICD-10-CM | POA: Diagnosis not present

## 2024-05-27 DIAGNOSIS — E782 Mixed hyperlipidemia: Secondary | ICD-10-CM | POA: Diagnosis not present

## 2024-06-08 DIAGNOSIS — H43813 Vitreous degeneration, bilateral: Secondary | ICD-10-CM | POA: Diagnosis not present

## 2024-06-08 DIAGNOSIS — H353231 Exudative age-related macular degeneration, bilateral, with active choroidal neovascularization: Secondary | ICD-10-CM | POA: Diagnosis not present

## 2024-07-30 DIAGNOSIS — H43813 Vitreous degeneration, bilateral: Secondary | ICD-10-CM | POA: Diagnosis not present

## 2024-07-30 DIAGNOSIS — H353231 Exudative age-related macular degeneration, bilateral, with active choroidal neovascularization: Secondary | ICD-10-CM | POA: Diagnosis not present

## 2024-08-17 DIAGNOSIS — H43813 Vitreous degeneration, bilateral: Secondary | ICD-10-CM | POA: Diagnosis not present

## 2024-08-17 DIAGNOSIS — H353231 Exudative age-related macular degeneration, bilateral, with active choroidal neovascularization: Secondary | ICD-10-CM | POA: Diagnosis not present
